# Patient Record
Sex: Female | Born: 1940 | Race: White | Hispanic: No | Marital: Married | Smoking: Never smoker
Health system: Southern US, Community
[De-identification: ages and names within clinical notes are randomized; demographics above are authoritative.]

## PROBLEM LIST (undated history)

## (undated) DIAGNOSIS — R011 Cardiac murmur, unspecified: Secondary | ICD-10-CM

## (undated) DIAGNOSIS — B029 Zoster without complications: Secondary | ICD-10-CM

## (undated) DIAGNOSIS — K219 Gastro-esophageal reflux disease without esophagitis: Secondary | ICD-10-CM

## (undated) DIAGNOSIS — H269 Unspecified cataract: Secondary | ICD-10-CM

## (undated) DIAGNOSIS — M1990 Unspecified osteoarthritis, unspecified site: Secondary | ICD-10-CM

## (undated) DIAGNOSIS — E7800 Pure hypercholesterolemia, unspecified: Secondary | ICD-10-CM

## (undated) DIAGNOSIS — N3289 Other specified disorders of bladder: Secondary | ICD-10-CM

## (undated) DIAGNOSIS — I10 Essential (primary) hypertension: Secondary | ICD-10-CM

## (undated) DIAGNOSIS — R42 Dizziness and giddiness: Secondary | ICD-10-CM

## (undated) DIAGNOSIS — E119 Type 2 diabetes mellitus without complications: Secondary | ICD-10-CM

## (undated) HISTORY — DX: Gastro-esophageal reflux disease without esophagitis: K21.9

## (undated) HISTORY — PX: CATARACT EXTRACTION W/ INTRAOCULAR LENS IMPLANT: SHX1309

## (undated) HISTORY — PX: TRIGGER FINGER RELEASE: SHX641

## (undated) HISTORY — PX: BREAST EXCISIONAL BIOPSY: SUR124

## (undated) HISTORY — DX: Unspecified osteoarthritis, unspecified site: M19.90

## (undated) HISTORY — PX: COLONOSCOPY: SHX174

---

## 1999-04-11 ENCOUNTER — Emergency Department (HOSPITAL_COMMUNITY): Admission: EM | Admit: 1999-04-11 | Discharge: 1999-04-11 | Payer: Self-pay | Admitting: *Deleted

## 2005-05-17 ENCOUNTER — Encounter: Admission: RE | Admit: 2005-05-17 | Discharge: 2005-05-17 | Payer: Self-pay | Admitting: Family Medicine

## 2005-09-14 ENCOUNTER — Encounter: Admission: RE | Admit: 2005-09-14 | Discharge: 2005-09-14 | Payer: Self-pay | Admitting: Family Medicine

## 2005-12-18 ENCOUNTER — Encounter: Admission: RE | Admit: 2005-12-18 | Discharge: 2006-01-17 | Payer: Self-pay | Admitting: Neurology

## 2006-05-20 ENCOUNTER — Encounter: Admission: RE | Admit: 2006-05-20 | Discharge: 2006-05-20 | Payer: Self-pay | Admitting: Family Medicine

## 2006-05-27 ENCOUNTER — Encounter: Admission: RE | Admit: 2006-05-27 | Discharge: 2006-05-27 | Payer: Self-pay | Admitting: Family Medicine

## 2006-12-10 ENCOUNTER — Encounter: Admission: RE | Admit: 2006-12-10 | Discharge: 2006-12-10 | Payer: Self-pay | Admitting: Family Medicine

## 2007-05-07 ENCOUNTER — Other Ambulatory Visit: Admission: RE | Admit: 2007-05-07 | Discharge: 2007-05-07 | Payer: Self-pay | Admitting: Family Medicine

## 2007-06-03 ENCOUNTER — Encounter: Admission: RE | Admit: 2007-06-03 | Discharge: 2007-06-03 | Payer: Self-pay | Admitting: Family Medicine

## 2008-05-10 ENCOUNTER — Encounter: Admission: RE | Admit: 2008-05-10 | Discharge: 2008-05-10 | Payer: Self-pay | Admitting: Family Medicine

## 2008-06-14 ENCOUNTER — Encounter: Admission: RE | Admit: 2008-06-14 | Discharge: 2008-06-14 | Payer: Self-pay | Admitting: Family Medicine

## 2009-03-25 ENCOUNTER — Emergency Department (HOSPITAL_COMMUNITY): Admission: EM | Admit: 2009-03-25 | Discharge: 2009-03-25 | Payer: Self-pay | Admitting: Emergency Medicine

## 2009-05-13 ENCOUNTER — Encounter: Admission: RE | Admit: 2009-05-13 | Discharge: 2009-05-13 | Payer: Self-pay | Admitting: Family Medicine

## 2009-10-06 ENCOUNTER — Encounter: Admission: RE | Admit: 2009-10-06 | Discharge: 2009-10-06 | Payer: Self-pay | Admitting: Family Medicine

## 2010-05-22 ENCOUNTER — Other Ambulatory Visit: Admission: RE | Admit: 2010-05-22 | Discharge: 2010-05-22 | Payer: Self-pay | Admitting: Family Medicine

## 2010-10-06 LAB — BASIC METABOLIC PANEL
BUN: 17 mg/dL (ref 6–23)
CO2: 26 mEq/L (ref 19–32)
Calcium: 9.3 mg/dL (ref 8.4–10.5)
Chloride: 103 mEq/L (ref 96–112)
Creatinine, Ser: 1.46 mg/dL — ABNORMAL HIGH (ref 0.4–1.2)
GFR calc Af Amer: 43 mL/min — ABNORMAL LOW (ref >60)
GFR calc non Af Amer: 36 mL/min — ABNORMAL LOW (ref >60)
Glucose, Bld: 141 mg/dL — ABNORMAL HIGH (ref 70–99)
Potassium: 4 mEq/L (ref 3.5–5.1)
Sodium: 137 mEq/L (ref 135–145)

## 2010-10-06 LAB — DIFFERENTIAL
Basophils Absolute: 0 10*3/uL (ref 0.0–0.1)
Basophils Relative: 0 % (ref 0–1)
Eosinophils Absolute: 0.2 10*3/uL (ref 0.0–0.7)
Eosinophils Relative: 2 % (ref 0–5)
Lymphocytes Relative: 15 % (ref 12–46)
Lymphs Abs: 1.9 10*3/uL (ref 0.7–4.0)
Monocytes Absolute: 0.7 10*3/uL (ref 0.1–1.0)
Monocytes Relative: 5 % (ref 3–12)
Neutro Abs: 10 10*3/uL — ABNORMAL HIGH (ref 1.7–7.7)
Neutrophils Relative %: 78 % — ABNORMAL HIGH (ref 43–77)

## 2010-10-06 LAB — CBC
HCT: 38.5 % (ref 36.0–46.0)
Hemoglobin: 13.1 g/dL (ref 12.0–15.0)
MCHC: 34 g/dL (ref 30.0–36.0)
MCV: 88.5 fL (ref 78.0–100.0)
Platelets: 271 10*3/uL (ref 150–400)
RBC: 4.35 MIL/uL (ref 3.87–5.11)
RDW: 13.2 % (ref 11.5–15.5)
WBC: 12.8 10*3/uL — ABNORMAL HIGH (ref 4.0–10.5)

## 2010-11-20 ENCOUNTER — Other Ambulatory Visit: Payer: Self-pay | Admitting: Family Medicine

## 2010-11-20 DIAGNOSIS — Z1231 Encounter for screening mammogram for malignant neoplasm of breast: Secondary | ICD-10-CM

## 2010-11-30 ENCOUNTER — Ambulatory Visit
Admission: RE | Admit: 2010-11-30 | Discharge: 2010-11-30 | Disposition: A | Discharge status: Home / Self Care | Payer: Medicare Other | Source: Ambulatory Visit | Attending: Family Medicine | Admitting: Family Medicine

## 2010-11-30 DIAGNOSIS — Z1231 Encounter for screening mammogram for malignant neoplasm of breast: Secondary | ICD-10-CM

## 2011-07-17 DIAGNOSIS — H7310 Chronic myringitis, unspecified ear: Secondary | ICD-10-CM | POA: Diagnosis not present

## 2011-07-18 DIAGNOSIS — L259 Unspecified contact dermatitis, unspecified cause: Secondary | ICD-10-CM | POA: Diagnosis not present

## 2011-07-18 DIAGNOSIS — L280 Lichen simplex chronicus: Secondary | ICD-10-CM | POA: Diagnosis not present

## 2011-07-30 DIAGNOSIS — H7310 Chronic myringitis, unspecified ear: Secondary | ICD-10-CM | POA: Diagnosis not present

## 2011-08-01 ENCOUNTER — Other Ambulatory Visit: Payer: Self-pay | Admitting: Otolaryngology

## 2011-08-01 DIAGNOSIS — H8110 Benign paroxysmal vertigo, unspecified ear: Secondary | ICD-10-CM

## 2011-08-07 DIAGNOSIS — H25019 Cortical age-related cataract, unspecified eye: Secondary | ICD-10-CM | POA: Diagnosis not present

## 2011-09-03 ENCOUNTER — Other Ambulatory Visit: Payer: Medicare Other

## 2011-09-10 ENCOUNTER — Ambulatory Visit
Admission: RE | Admit: 2011-09-10 | Discharge: 2011-09-10 | Disposition: A | Discharge status: Home / Self Care | Payer: Medicare Other | Source: Ambulatory Visit | Attending: Otolaryngology | Admitting: Otolaryngology

## 2011-09-10 DIAGNOSIS — H8110 Benign paroxysmal vertigo, unspecified ear: Secondary | ICD-10-CM

## 2011-09-10 DIAGNOSIS — J320 Chronic maxillary sinusitis: Secondary | ICD-10-CM | POA: Diagnosis not present

## 2011-09-10 MED ORDER — GADOBENATE DIMEGLUMINE 529 MG/ML IV SOLN
15.0000 mL | Freq: Once | INTRAVENOUS | Status: AC | PRN
  Administered 2011-09-10: 15 mL via INTRAVENOUS

## 2011-09-26 DIAGNOSIS — H8110 Benign paroxysmal vertigo, unspecified ear: Secondary | ICD-10-CM | POA: Diagnosis not present

## 2011-10-01 DIAGNOSIS — R42 Dizziness and giddiness: Secondary | ICD-10-CM | POA: Diagnosis not present

## 2011-10-04 ENCOUNTER — Ambulatory Visit: Payer: Medicare Other | Admitting: Physical Therapy

## 2011-10-09 ENCOUNTER — Ambulatory Visit: Payer: Medicare Other | Attending: Neurology | Admitting: Physical Therapy

## 2011-10-09 DIAGNOSIS — H8110 Benign paroxysmal vertigo, unspecified ear: Secondary | ICD-10-CM | POA: Insufficient documentation

## 2011-10-09 DIAGNOSIS — IMO0001 Reserved for inherently not codable concepts without codable children: Secondary | ICD-10-CM | POA: Insufficient documentation

## 2011-10-12 ENCOUNTER — Ambulatory Visit: Payer: Medicare Other | Admitting: Rehabilitative and Restorative Service Providers"

## 2011-10-12 DIAGNOSIS — H8110 Benign paroxysmal vertigo, unspecified ear: Secondary | ICD-10-CM | POA: Diagnosis not present

## 2011-10-12 DIAGNOSIS — IMO0001 Reserved for inherently not codable concepts without codable children: Secondary | ICD-10-CM | POA: Diagnosis not present

## 2011-10-17 ENCOUNTER — Ambulatory Visit: Payer: Medicare Other | Admitting: Physical Therapy

## 2011-10-17 DIAGNOSIS — IMO0001 Reserved for inherently not codable concepts without codable children: Secondary | ICD-10-CM | POA: Diagnosis not present

## 2011-10-17 DIAGNOSIS — H8110 Benign paroxysmal vertigo, unspecified ear: Secondary | ICD-10-CM | POA: Diagnosis not present

## 2011-10-19 ENCOUNTER — Ambulatory Visit: Payer: Medicare Other | Admitting: Rehabilitative and Restorative Service Providers"

## 2011-10-19 DIAGNOSIS — IMO0001 Reserved for inherently not codable concepts without codable children: Secondary | ICD-10-CM | POA: Diagnosis not present

## 2011-10-19 DIAGNOSIS — H8110 Benign paroxysmal vertigo, unspecified ear: Secondary | ICD-10-CM | POA: Diagnosis not present

## 2011-10-23 ENCOUNTER — Encounter: Payer: PRIVATE HEALTH INSURANCE | Admitting: Physical Therapy

## 2011-10-25 ENCOUNTER — Encounter: Payer: PRIVATE HEALTH INSURANCE | Admitting: Physical Therapy

## 2011-10-30 ENCOUNTER — Encounter: Payer: PRIVATE HEALTH INSURANCE | Admitting: Physical Therapy

## 2011-11-01 ENCOUNTER — Encounter: Payer: PRIVATE HEALTH INSURANCE | Admitting: Rehabilitative and Restorative Service Providers"

## 2011-11-06 ENCOUNTER — Ambulatory Visit: Payer: Medicare Other | Attending: Neurology | Admitting: Physical Therapy

## 2011-11-06 DIAGNOSIS — H8110 Benign paroxysmal vertigo, unspecified ear: Secondary | ICD-10-CM | POA: Diagnosis not present

## 2011-11-06 DIAGNOSIS — IMO0001 Reserved for inherently not codable concepts without codable children: Secondary | ICD-10-CM | POA: Diagnosis not present

## 2011-11-08 ENCOUNTER — Ambulatory Visit: Payer: Medicare Other | Admitting: Physical Therapy

## 2011-11-08 DIAGNOSIS — H8110 Benign paroxysmal vertigo, unspecified ear: Secondary | ICD-10-CM | POA: Diagnosis not present

## 2011-11-08 DIAGNOSIS — IMO0001 Reserved for inherently not codable concepts without codable children: Secondary | ICD-10-CM | POA: Diagnosis not present

## 2011-11-09 ENCOUNTER — Other Ambulatory Visit: Payer: Self-pay | Admitting: Family Medicine

## 2011-11-09 DIAGNOSIS — Z1231 Encounter for screening mammogram for malignant neoplasm of breast: Secondary | ICD-10-CM

## 2011-11-13 ENCOUNTER — Ambulatory Visit: Payer: Medicare Other | Admitting: Physical Therapy

## 2011-11-13 DIAGNOSIS — H8110 Benign paroxysmal vertigo, unspecified ear: Secondary | ICD-10-CM | POA: Diagnosis not present

## 2011-11-13 DIAGNOSIS — IMO0001 Reserved for inherently not codable concepts without codable children: Secondary | ICD-10-CM | POA: Diagnosis not present

## 2011-11-15 ENCOUNTER — Ambulatory Visit: Payer: Medicare Other | Admitting: Physical Therapy

## 2011-11-15 DIAGNOSIS — H8110 Benign paroxysmal vertigo, unspecified ear: Secondary | ICD-10-CM | POA: Diagnosis not present

## 2011-11-15 DIAGNOSIS — IMO0001 Reserved for inherently not codable concepts without codable children: Secondary | ICD-10-CM | POA: Diagnosis not present

## 2011-11-19 ENCOUNTER — Ambulatory Visit: Payer: Medicare Other | Admitting: Physical Therapy

## 2011-11-19 DIAGNOSIS — H8110 Benign paroxysmal vertigo, unspecified ear: Secondary | ICD-10-CM | POA: Diagnosis not present

## 2011-11-19 DIAGNOSIS — IMO0001 Reserved for inherently not codable concepts without codable children: Secondary | ICD-10-CM | POA: Diagnosis not present

## 2011-11-21 ENCOUNTER — Ambulatory Visit: Payer: Medicare Other | Admitting: Physical Therapy

## 2011-11-21 DIAGNOSIS — IMO0001 Reserved for inherently not codable concepts without codable children: Secondary | ICD-10-CM | POA: Diagnosis not present

## 2011-11-21 DIAGNOSIS — H8110 Benign paroxysmal vertigo, unspecified ear: Secondary | ICD-10-CM | POA: Diagnosis not present

## 2011-12-04 ENCOUNTER — Ambulatory Visit: Payer: PRIVATE HEALTH INSURANCE

## 2011-12-05 ENCOUNTER — Ambulatory Visit
Admission: RE | Admit: 2011-12-05 | Discharge: 2011-12-05 | Disposition: A | Discharge status: Home / Self Care | Payer: PRIVATE HEALTH INSURANCE | Source: Ambulatory Visit | Attending: Family Medicine | Admitting: Family Medicine

## 2011-12-05 DIAGNOSIS — Z1231 Encounter for screening mammogram for malignant neoplasm of breast: Secondary | ICD-10-CM

## 2011-12-12 ENCOUNTER — Ambulatory Visit
Admission: RE | Admit: 2011-12-12 | Discharge: 2011-12-12 | Disposition: A | Discharge status: Home / Self Care | Payer: Medicare Other | Source: Ambulatory Visit | Attending: Family Medicine | Admitting: Family Medicine

## 2011-12-12 ENCOUNTER — Other Ambulatory Visit: Payer: Self-pay | Admitting: Family Medicine

## 2011-12-12 DIAGNOSIS — E119 Type 2 diabetes mellitus without complications: Secondary | ICD-10-CM | POA: Diagnosis not present

## 2011-12-12 DIAGNOSIS — M79609 Pain in unspecified limb: Secondary | ICD-10-CM | POA: Diagnosis not present

## 2011-12-12 DIAGNOSIS — M899 Disorder of bone, unspecified: Secondary | ICD-10-CM | POA: Diagnosis not present

## 2011-12-12 DIAGNOSIS — I10 Essential (primary) hypertension: Secondary | ICD-10-CM | POA: Diagnosis not present

## 2011-12-12 DIAGNOSIS — M7989 Other specified soft tissue disorders: Secondary | ICD-10-CM | POA: Diagnosis not present

## 2011-12-12 DIAGNOSIS — R011 Cardiac murmur, unspecified: Secondary | ICD-10-CM | POA: Diagnosis not present

## 2011-12-12 DIAGNOSIS — E7800 Pure hypercholesterolemia, unspecified: Secondary | ICD-10-CM | POA: Diagnosis not present

## 2011-12-12 DIAGNOSIS — N814 Uterovaginal prolapse, unspecified: Secondary | ICD-10-CM | POA: Diagnosis not present

## 2012-01-23 DIAGNOSIS — R109 Unspecified abdominal pain: Secondary | ICD-10-CM | POA: Diagnosis not present

## 2012-02-04 DIAGNOSIS — N814 Uterovaginal prolapse, unspecified: Secondary | ICD-10-CM | POA: Diagnosis not present

## 2012-02-04 DIAGNOSIS — R3915 Urgency of urination: Secondary | ICD-10-CM | POA: Diagnosis not present

## 2012-02-04 DIAGNOSIS — E7800 Pure hypercholesterolemia, unspecified: Secondary | ICD-10-CM | POA: Diagnosis not present

## 2012-02-04 DIAGNOSIS — N390 Urinary tract infection, site not specified: Secondary | ICD-10-CM | POA: Diagnosis not present

## 2012-02-19 DIAGNOSIS — N390 Urinary tract infection, site not specified: Secondary | ICD-10-CM | POA: Diagnosis not present

## 2012-02-19 DIAGNOSIS — R4182 Altered mental status, unspecified: Secondary | ICD-10-CM | POA: Diagnosis not present

## 2012-02-19 DIAGNOSIS — R0602 Shortness of breath: Secondary | ICD-10-CM | POA: Diagnosis not present

## 2012-02-19 DIAGNOSIS — R509 Fever, unspecified: Secondary | ICD-10-CM | POA: Diagnosis not present

## 2012-02-22 DIAGNOSIS — N390 Urinary tract infection, site not specified: Secondary | ICD-10-CM | POA: Diagnosis not present

## 2012-02-28 DIAGNOSIS — N390 Urinary tract infection, site not specified: Secondary | ICD-10-CM | POA: Diagnosis not present

## 2012-02-28 DIAGNOSIS — R3915 Urgency of urination: Secondary | ICD-10-CM | POA: Diagnosis not present

## 2012-03-07 DIAGNOSIS — R350 Frequency of micturition: Secondary | ICD-10-CM | POA: Diagnosis not present

## 2012-05-21 DIAGNOSIS — L280 Lichen simplex chronicus: Secondary | ICD-10-CM | POA: Diagnosis not present

## 2012-05-21 DIAGNOSIS — L821 Other seborrheic keratosis: Secondary | ICD-10-CM | POA: Diagnosis not present

## 2012-05-21 DIAGNOSIS — I789 Disease of capillaries, unspecified: Secondary | ICD-10-CM | POA: Diagnosis not present

## 2012-06-04 DIAGNOSIS — Z23 Encounter for immunization: Secondary | ICD-10-CM | POA: Diagnosis not present

## 2012-06-12 DIAGNOSIS — Z1331 Encounter for screening for depression: Secondary | ICD-10-CM | POA: Diagnosis not present

## 2012-06-12 DIAGNOSIS — D485 Neoplasm of uncertain behavior of skin: Secondary | ICD-10-CM | POA: Diagnosis not present

## 2012-06-12 DIAGNOSIS — R232 Flushing: Secondary | ICD-10-CM | POA: Diagnosis not present

## 2012-06-12 DIAGNOSIS — E7800 Pure hypercholesterolemia, unspecified: Secondary | ICD-10-CM | POA: Diagnosis not present

## 2012-06-12 DIAGNOSIS — I10 Essential (primary) hypertension: Secondary | ICD-10-CM | POA: Diagnosis not present

## 2012-06-12 DIAGNOSIS — Z1329 Encounter for screening for other suspected endocrine disorder: Secondary | ICD-10-CM | POA: Diagnosis not present

## 2012-06-12 DIAGNOSIS — E559 Vitamin D deficiency, unspecified: Secondary | ICD-10-CM | POA: Diagnosis not present

## 2012-06-12 DIAGNOSIS — M899 Disorder of bone, unspecified: Secondary | ICD-10-CM | POA: Diagnosis not present

## 2012-06-12 DIAGNOSIS — M949 Disorder of cartilage, unspecified: Secondary | ICD-10-CM | POA: Diagnosis not present

## 2012-06-12 DIAGNOSIS — E119 Type 2 diabetes mellitus without complications: Secondary | ICD-10-CM | POA: Diagnosis not present

## 2012-06-17 DIAGNOSIS — H903 Sensorineural hearing loss, bilateral: Secondary | ICD-10-CM | POA: Diagnosis not present

## 2012-06-17 DIAGNOSIS — H6120 Impacted cerumen, unspecified ear: Secondary | ICD-10-CM | POA: Diagnosis not present

## 2012-06-17 DIAGNOSIS — H832X3 Labyrinthine dysfunction, bilateral: Secondary | ICD-10-CM | POA: Diagnosis not present

## 2012-06-19 ENCOUNTER — Other Ambulatory Visit: Payer: Self-pay | Admitting: Family Medicine

## 2012-06-19 DIAGNOSIS — L819 Disorder of pigmentation, unspecified: Secondary | ICD-10-CM | POA: Diagnosis not present

## 2012-06-19 DIAGNOSIS — L570 Actinic keratosis: Secondary | ICD-10-CM | POA: Diagnosis not present

## 2012-09-15 DIAGNOSIS — E7800 Pure hypercholesterolemia, unspecified: Secondary | ICD-10-CM | POA: Diagnosis not present

## 2012-09-15 DIAGNOSIS — R3915 Urgency of urination: Secondary | ICD-10-CM | POA: Diagnosis not present

## 2012-10-16 DIAGNOSIS — M7700 Medial epicondylitis, unspecified elbow: Secondary | ICD-10-CM | POA: Diagnosis not present

## 2012-10-24 DIAGNOSIS — M7700 Medial epicondylitis, unspecified elbow: Secondary | ICD-10-CM | POA: Diagnosis not present

## 2012-11-03 ENCOUNTER — Other Ambulatory Visit: Payer: Self-pay

## 2012-11-03 DIAGNOSIS — Z1231 Encounter for screening mammogram for malignant neoplasm of breast: Secondary | ICD-10-CM

## 2012-11-14 DIAGNOSIS — M7700 Medial epicondylitis, unspecified elbow: Secondary | ICD-10-CM | POA: Diagnosis not present

## 2012-11-19 DIAGNOSIS — M7700 Medial epicondylitis, unspecified elbow: Secondary | ICD-10-CM | POA: Diagnosis not present

## 2012-11-26 DIAGNOSIS — M7700 Medial epicondylitis, unspecified elbow: Secondary | ICD-10-CM | POA: Diagnosis not present

## 2012-12-03 DIAGNOSIS — M7700 Medial epicondylitis, unspecified elbow: Secondary | ICD-10-CM | POA: Diagnosis not present

## 2012-12-09 DIAGNOSIS — E559 Vitamin D deficiency, unspecified: Secondary | ICD-10-CM | POA: Diagnosis not present

## 2012-12-09 DIAGNOSIS — E119 Type 2 diabetes mellitus without complications: Secondary | ICD-10-CM | POA: Diagnosis not present

## 2012-12-09 DIAGNOSIS — M949 Disorder of cartilage, unspecified: Secondary | ICD-10-CM | POA: Diagnosis not present

## 2012-12-09 DIAGNOSIS — E7800 Pure hypercholesterolemia, unspecified: Secondary | ICD-10-CM | POA: Diagnosis not present

## 2012-12-09 DIAGNOSIS — I10 Essential (primary) hypertension: Secondary | ICD-10-CM | POA: Diagnosis not present

## 2012-12-10 DIAGNOSIS — M7700 Medial epicondylitis, unspecified elbow: Secondary | ICD-10-CM | POA: Diagnosis not present

## 2012-12-11 DIAGNOSIS — H349 Unspecified retinal vascular occlusion: Secondary | ICD-10-CM | POA: Diagnosis not present

## 2012-12-11 DIAGNOSIS — E1139 Type 2 diabetes mellitus with other diabetic ophthalmic complication: Secondary | ICD-10-CM | POA: Diagnosis not present

## 2012-12-11 DIAGNOSIS — H524 Presbyopia: Secondary | ICD-10-CM | POA: Diagnosis not present

## 2012-12-11 DIAGNOSIS — H25019 Cortical age-related cataract, unspecified eye: Secondary | ICD-10-CM | POA: Diagnosis not present

## 2012-12-17 DIAGNOSIS — M7700 Medial epicondylitis, unspecified elbow: Secondary | ICD-10-CM | POA: Diagnosis not present

## 2012-12-31 DIAGNOSIS — M7700 Medial epicondylitis, unspecified elbow: Secondary | ICD-10-CM | POA: Diagnosis not present

## 2013-01-09 ENCOUNTER — Ambulatory Visit
Admission: RE | Admit: 2013-01-09 | Discharge: 2013-01-09 | Disposition: A | Discharge status: Home / Self Care | Payer: Medicare Other | Source: Ambulatory Visit

## 2013-01-09 DIAGNOSIS — Z1231 Encounter for screening mammogram for malignant neoplasm of breast: Secondary | ICD-10-CM

## 2013-01-09 DIAGNOSIS — M7700 Medial epicondylitis, unspecified elbow: Secondary | ICD-10-CM | POA: Diagnosis not present

## 2013-01-12 ENCOUNTER — Other Ambulatory Visit: Payer: Self-pay | Admitting: Family Medicine

## 2013-01-12 DIAGNOSIS — N6459 Other signs and symptoms in breast: Secondary | ICD-10-CM

## 2013-01-16 DIAGNOSIS — M7700 Medial epicondylitis, unspecified elbow: Secondary | ICD-10-CM | POA: Diagnosis not present

## 2013-01-21 DIAGNOSIS — M949 Disorder of cartilage, unspecified: Secondary | ICD-10-CM | POA: Diagnosis not present

## 2013-01-21 DIAGNOSIS — M7700 Medial epicondylitis, unspecified elbow: Secondary | ICD-10-CM | POA: Diagnosis not present

## 2013-01-21 DIAGNOSIS — M899 Disorder of bone, unspecified: Secondary | ICD-10-CM | POA: Diagnosis not present

## 2013-01-26 DIAGNOSIS — M899 Disorder of bone, unspecified: Secondary | ICD-10-CM | POA: Diagnosis not present

## 2013-01-28 ENCOUNTER — Other Ambulatory Visit: Payer: Medicare Other

## 2013-01-28 DIAGNOSIS — M7700 Medial epicondylitis, unspecified elbow: Secondary | ICD-10-CM | POA: Diagnosis not present

## 2013-02-03 DIAGNOSIS — M7700 Medial epicondylitis, unspecified elbow: Secondary | ICD-10-CM | POA: Diagnosis not present

## 2013-02-05 ENCOUNTER — Ambulatory Visit
Admission: RE | Admit: 2013-02-05 | Discharge: 2013-02-05 | Disposition: A | Discharge status: Home / Self Care | Payer: Medicare Other | Source: Ambulatory Visit | Attending: Family Medicine | Admitting: Family Medicine

## 2013-02-05 DIAGNOSIS — N6459 Other signs and symptoms in breast: Secondary | ICD-10-CM | POA: Diagnosis not present

## 2013-02-09 DIAGNOSIS — M7700 Medial epicondylitis, unspecified elbow: Secondary | ICD-10-CM | POA: Diagnosis not present

## 2013-04-02 DIAGNOSIS — H348392 Tributary (branch) retinal vein occlusion, unspecified eye, stable: Secondary | ICD-10-CM | POA: Diagnosis not present

## 2013-05-26 DIAGNOSIS — N951 Menopausal and female climacteric states: Secondary | ICD-10-CM | POA: Diagnosis not present

## 2013-05-26 DIAGNOSIS — Z01419 Encounter for gynecological examination (general) (routine) without abnormal findings: Secondary | ICD-10-CM | POA: Diagnosis not present

## 2013-06-10 DIAGNOSIS — I10 Essential (primary) hypertension: Secondary | ICD-10-CM | POA: Diagnosis not present

## 2013-06-10 DIAGNOSIS — E1139 Type 2 diabetes mellitus with other diabetic ophthalmic complication: Secondary | ICD-10-CM | POA: Diagnosis not present

## 2013-06-10 DIAGNOSIS — Z23 Encounter for immunization: Secondary | ICD-10-CM | POA: Diagnosis not present

## 2013-06-10 DIAGNOSIS — R1310 Dysphagia, unspecified: Secondary | ICD-10-CM | POA: Diagnosis not present

## 2013-06-10 DIAGNOSIS — E559 Vitamin D deficiency, unspecified: Secondary | ICD-10-CM | POA: Diagnosis not present

## 2013-06-10 DIAGNOSIS — E7800 Pure hypercholesterolemia, unspecified: Secondary | ICD-10-CM | POA: Diagnosis not present

## 2013-06-10 DIAGNOSIS — M62838 Other muscle spasm: Secondary | ICD-10-CM | POA: Diagnosis not present

## 2013-06-10 DIAGNOSIS — Z1211 Encounter for screening for malignant neoplasm of colon: Secondary | ICD-10-CM | POA: Diagnosis not present

## 2013-06-10 DIAGNOSIS — M899 Disorder of bone, unspecified: Secondary | ICD-10-CM | POA: Diagnosis not present

## 2013-07-10 DIAGNOSIS — L909 Atrophic disorder of skin, unspecified: Secondary | ICD-10-CM | POA: Diagnosis not present

## 2013-07-10 DIAGNOSIS — L821 Other seborrheic keratosis: Secondary | ICD-10-CM | POA: Diagnosis not present

## 2013-07-10 DIAGNOSIS — I781 Nevus, non-neoplastic: Secondary | ICD-10-CM | POA: Diagnosis not present

## 2013-07-23 ENCOUNTER — Other Ambulatory Visit: Payer: Self-pay | Admitting: Gastroenterology

## 2013-07-23 DIAGNOSIS — R1310 Dysphagia, unspecified: Secondary | ICD-10-CM

## 2013-07-23 DIAGNOSIS — Z1211 Encounter for screening for malignant neoplasm of colon: Secondary | ICD-10-CM | POA: Diagnosis not present

## 2013-07-27 ENCOUNTER — Ambulatory Visit
Admission: RE | Admit: 2013-07-27 | Discharge: 2013-07-27 | Disposition: A | Discharge status: Home / Self Care | Payer: Medicare Other | Source: Ambulatory Visit | Attending: Gastroenterology | Admitting: Gastroenterology

## 2013-07-27 DIAGNOSIS — R1310 Dysphagia, unspecified: Secondary | ICD-10-CM

## 2013-08-17 DIAGNOSIS — Z79899 Other long term (current) drug therapy: Secondary | ICD-10-CM | POA: Diagnosis not present

## 2013-08-17 DIAGNOSIS — E7800 Pure hypercholesterolemia, unspecified: Secondary | ICD-10-CM | POA: Diagnosis not present

## 2013-09-08 ENCOUNTER — Other Ambulatory Visit: Payer: Self-pay | Admitting: Gastroenterology

## 2013-09-08 DIAGNOSIS — K5730 Diverticulosis of large intestine without perforation or abscess without bleeding: Secondary | ICD-10-CM | POA: Diagnosis not present

## 2013-09-08 DIAGNOSIS — D129 Benign neoplasm of anus and anal canal: Secondary | ICD-10-CM | POA: Diagnosis not present

## 2013-09-08 DIAGNOSIS — D126 Benign neoplasm of colon, unspecified: Secondary | ICD-10-CM | POA: Diagnosis not present

## 2013-09-08 DIAGNOSIS — Z1211 Encounter for screening for malignant neoplasm of colon: Secondary | ICD-10-CM | POA: Diagnosis not present

## 2013-09-08 DIAGNOSIS — D128 Benign neoplasm of rectum: Secondary | ICD-10-CM | POA: Diagnosis not present

## 2013-10-08 DIAGNOSIS — H348392 Tributary (branch) retinal vein occlusion, unspecified eye, stable: Secondary | ICD-10-CM | POA: Diagnosis not present

## 2013-12-07 DIAGNOSIS — M899 Disorder of bone, unspecified: Secondary | ICD-10-CM | POA: Diagnosis not present

## 2013-12-07 DIAGNOSIS — Z1331 Encounter for screening for depression: Secondary | ICD-10-CM | POA: Diagnosis not present

## 2013-12-07 DIAGNOSIS — I10 Essential (primary) hypertension: Secondary | ICD-10-CM | POA: Diagnosis not present

## 2013-12-07 DIAGNOSIS — Z23 Encounter for immunization: Secondary | ICD-10-CM | POA: Diagnosis not present

## 2013-12-07 DIAGNOSIS — E1139 Type 2 diabetes mellitus with other diabetic ophthalmic complication: Secondary | ICD-10-CM | POA: Diagnosis not present

## 2013-12-07 DIAGNOSIS — E559 Vitamin D deficiency, unspecified: Secondary | ICD-10-CM | POA: Diagnosis not present

## 2013-12-07 DIAGNOSIS — E7800 Pure hypercholesterolemia, unspecified: Secondary | ICD-10-CM | POA: Diagnosis not present

## 2013-12-07 DIAGNOSIS — M949 Disorder of cartilage, unspecified: Secondary | ICD-10-CM | POA: Diagnosis not present

## 2014-01-15 DIAGNOSIS — H25019 Cortical age-related cataract, unspecified eye: Secondary | ICD-10-CM | POA: Diagnosis not present

## 2014-01-15 DIAGNOSIS — H348392 Tributary (branch) retinal vein occlusion, unspecified eye, stable: Secondary | ICD-10-CM | POA: Diagnosis not present

## 2014-01-15 DIAGNOSIS — E1139 Type 2 diabetes mellitus with other diabetic ophthalmic complication: Secondary | ICD-10-CM | POA: Diagnosis not present

## 2014-01-15 DIAGNOSIS — H2510 Age-related nuclear cataract, unspecified eye: Secondary | ICD-10-CM | POA: Diagnosis not present

## 2014-01-20 ENCOUNTER — Other Ambulatory Visit: Payer: Self-pay

## 2014-01-20 DIAGNOSIS — Z1231 Encounter for screening mammogram for malignant neoplasm of breast: Secondary | ICD-10-CM

## 2014-02-09 ENCOUNTER — Ambulatory Visit
Admission: RE | Admit: 2014-02-09 | Discharge: 2014-02-09 | Disposition: A | Discharge status: Home / Self Care | Payer: Medicare Other | Source: Ambulatory Visit

## 2014-02-09 ENCOUNTER — Encounter (INDEPENDENT_AMBULATORY_CARE_PROVIDER_SITE_OTHER): Payer: Self-pay

## 2014-02-09 DIAGNOSIS — Z1231 Encounter for screening mammogram for malignant neoplasm of breast: Secondary | ICD-10-CM

## 2014-04-08 DIAGNOSIS — H34831 Tributary (branch) retinal vein occlusion, right eye: Secondary | ICD-10-CM | POA: Diagnosis not present

## 2014-04-08 DIAGNOSIS — H43813 Vitreous degeneration, bilateral: Secondary | ICD-10-CM | POA: Diagnosis not present

## 2014-04-08 DIAGNOSIS — E119 Type 2 diabetes mellitus without complications: Secondary | ICD-10-CM | POA: Diagnosis not present

## 2014-05-06 DIAGNOSIS — Z23 Encounter for immunization: Secondary | ICD-10-CM | POA: Diagnosis not present

## 2014-06-11 DIAGNOSIS — Z1389 Encounter for screening for other disorder: Secondary | ICD-10-CM | POA: Diagnosis not present

## 2014-06-11 DIAGNOSIS — E780 Pure hypercholesterolemia: Secondary | ICD-10-CM | POA: Diagnosis not present

## 2014-06-11 DIAGNOSIS — E559 Vitamin D deficiency, unspecified: Secondary | ICD-10-CM | POA: Diagnosis not present

## 2014-06-11 DIAGNOSIS — E119 Type 2 diabetes mellitus without complications: Secondary | ICD-10-CM | POA: Diagnosis not present

## 2014-06-11 DIAGNOSIS — I10 Essential (primary) hypertension: Secondary | ICD-10-CM | POA: Diagnosis not present

## 2014-06-11 DIAGNOSIS — M899 Disorder of bone, unspecified: Secondary | ICD-10-CM | POA: Diagnosis not present

## 2014-06-14 DIAGNOSIS — H6123 Impacted cerumen, bilateral: Secondary | ICD-10-CM | POA: Diagnosis not present

## 2014-06-14 DIAGNOSIS — H903 Sensorineural hearing loss, bilateral: Secondary | ICD-10-CM | POA: Diagnosis not present

## 2014-07-13 DIAGNOSIS — L918 Other hypertrophic disorders of the skin: Secondary | ICD-10-CM | POA: Diagnosis not present

## 2014-07-13 DIAGNOSIS — D1800 Hemangioma unspecified site: Secondary | ICD-10-CM | POA: Diagnosis not present

## 2014-07-13 DIAGNOSIS — L570 Actinic keratosis: Secondary | ICD-10-CM | POA: Diagnosis not present

## 2014-07-13 DIAGNOSIS — L821 Other seborrheic keratosis: Secondary | ICD-10-CM | POA: Diagnosis not present

## 2014-10-21 DIAGNOSIS — E119 Type 2 diabetes mellitus without complications: Secondary | ICD-10-CM | POA: Diagnosis not present

## 2014-10-21 DIAGNOSIS — H34831 Tributary (branch) retinal vein occlusion, right eye: Secondary | ICD-10-CM | POA: Diagnosis not present

## 2014-10-21 DIAGNOSIS — H43813 Vitreous degeneration, bilateral: Secondary | ICD-10-CM | POA: Diagnosis not present

## 2014-10-27 DIAGNOSIS — S61432A Puncture wound without foreign body of left hand, initial encounter: Secondary | ICD-10-CM | POA: Diagnosis not present

## 2014-12-15 DIAGNOSIS — I10 Essential (primary) hypertension: Secondary | ICD-10-CM | POA: Diagnosis not present

## 2014-12-15 DIAGNOSIS — M899 Disorder of bone, unspecified: Secondary | ICD-10-CM | POA: Diagnosis not present

## 2014-12-15 DIAGNOSIS — E119 Type 2 diabetes mellitus without complications: Secondary | ICD-10-CM | POA: Diagnosis not present

## 2014-12-15 DIAGNOSIS — E780 Pure hypercholesterolemia: Secondary | ICD-10-CM | POA: Diagnosis not present

## 2014-12-15 DIAGNOSIS — E559 Vitamin D deficiency, unspecified: Secondary | ICD-10-CM | POA: Diagnosis not present

## 2014-12-15 DIAGNOSIS — B349 Viral infection, unspecified: Secondary | ICD-10-CM | POA: Diagnosis not present

## 2014-12-15 DIAGNOSIS — D485 Neoplasm of uncertain behavior of skin: Secondary | ICD-10-CM | POA: Diagnosis not present

## 2015-01-06 DIAGNOSIS — H25013 Cortical age-related cataract, bilateral: Secondary | ICD-10-CM | POA: Diagnosis not present

## 2015-01-06 DIAGNOSIS — H2513 Age-related nuclear cataract, bilateral: Secondary | ICD-10-CM | POA: Diagnosis not present

## 2015-01-06 DIAGNOSIS — E119 Type 2 diabetes mellitus without complications: Secondary | ICD-10-CM | POA: Diagnosis not present

## 2015-01-06 DIAGNOSIS — H349 Unspecified retinal vascular occlusion: Secondary | ICD-10-CM | POA: Diagnosis not present

## 2015-01-12 ENCOUNTER — Other Ambulatory Visit: Payer: Self-pay

## 2015-01-12 DIAGNOSIS — Z1231 Encounter for screening mammogram for malignant neoplasm of breast: Secondary | ICD-10-CM

## 2015-02-02 DIAGNOSIS — M859 Disorder of bone density and structure, unspecified: Secondary | ICD-10-CM | POA: Diagnosis not present

## 2015-02-02 DIAGNOSIS — M8589 Other specified disorders of bone density and structure, multiple sites: Secondary | ICD-10-CM | POA: Diagnosis not present

## 2015-02-11 ENCOUNTER — Ambulatory Visit: Payer: PRIVATE HEALTH INSURANCE

## 2015-02-16 ENCOUNTER — Ambulatory Visit
Admission: RE | Admit: 2015-02-16 | Discharge: 2015-02-16 | Disposition: A | Discharge status: Home / Self Care | Payer: Medicare Other | Source: Ambulatory Visit

## 2015-02-16 ENCOUNTER — Other Ambulatory Visit: Payer: Self-pay

## 2015-02-16 DIAGNOSIS — Z1231 Encounter for screening mammogram for malignant neoplasm of breast: Secondary | ICD-10-CM

## 2015-02-18 ENCOUNTER — Other Ambulatory Visit: Payer: Self-pay | Admitting: Family Medicine

## 2015-02-18 DIAGNOSIS — R928 Other abnormal and inconclusive findings on diagnostic imaging of breast: Secondary | ICD-10-CM

## 2015-02-24 ENCOUNTER — Other Ambulatory Visit: Payer: Self-pay | Admitting: Family Medicine

## 2015-02-24 ENCOUNTER — Ambulatory Visit
Admission: RE | Admit: 2015-02-24 | Discharge: 2015-02-24 | Disposition: A | Discharge status: Home / Self Care | Payer: Medicare Other | Source: Ambulatory Visit | Attending: Family Medicine | Admitting: Family Medicine

## 2015-02-24 DIAGNOSIS — R928 Other abnormal and inconclusive findings on diagnostic imaging of breast: Secondary | ICD-10-CM

## 2015-03-01 ENCOUNTER — Ambulatory Visit
Admission: RE | Admit: 2015-03-01 | Discharge: 2015-03-01 | Disposition: A | Discharge status: Home / Self Care | Payer: Medicare Other | Source: Ambulatory Visit | Attending: Family Medicine | Admitting: Family Medicine

## 2015-03-01 ENCOUNTER — Other Ambulatory Visit: Payer: Self-pay | Admitting: Family Medicine

## 2015-03-01 DIAGNOSIS — N63 Unspecified lump in breast: Secondary | ICD-10-CM | POA: Diagnosis not present

## 2015-03-01 DIAGNOSIS — R928 Other abnormal and inconclusive findings on diagnostic imaging of breast: Secondary | ICD-10-CM

## 2015-03-01 DIAGNOSIS — N6489 Other specified disorders of breast: Secondary | ICD-10-CM | POA: Diagnosis not present

## 2015-05-30 ENCOUNTER — Emergency Department (HOSPITAL_COMMUNITY): Payer: Medicare Other

## 2015-05-30 ENCOUNTER — Encounter (HOSPITAL_COMMUNITY): Payer: Self-pay | Admitting: *Deleted

## 2015-05-30 ENCOUNTER — Emergency Department (HOSPITAL_COMMUNITY)
Admission: EM | Admit: 2015-05-30 | Discharge: 2015-05-30 | Disposition: A | Discharge status: Home / Self Care | Payer: Medicare Other | Attending: Emergency Medicine | Admitting: Emergency Medicine

## 2015-05-30 DIAGNOSIS — E119 Type 2 diabetes mellitus without complications: Secondary | ICD-10-CM | POA: Diagnosis not present

## 2015-05-30 DIAGNOSIS — N12 Tubulo-interstitial nephritis, not specified as acute or chronic: Secondary | ICD-10-CM | POA: Insufficient documentation

## 2015-05-30 DIAGNOSIS — I10 Essential (primary) hypertension: Secondary | ICD-10-CM | POA: Diagnosis not present

## 2015-05-30 DIAGNOSIS — R109 Unspecified abdominal pain: Secondary | ICD-10-CM | POA: Diagnosis not present

## 2015-05-30 HISTORY — DX: Type 2 diabetes mellitus without complications: E11.9

## 2015-05-30 HISTORY — DX: Essential (primary) hypertension: I10

## 2015-05-30 LAB — URINALYSIS, ROUTINE W REFLEX MICROSCOPIC
Bilirubin Urine: NEGATIVE
GLUCOSE, UA: NEGATIVE mg/dL
KETONES UR: NEGATIVE mg/dL
NITRITE: NEGATIVE
PH: 6.5 (ref 5.0–8.0)
PROTEIN: NEGATIVE mg/dL
Specific Gravity, Urine: 1.018 (ref 1.005–1.030)

## 2015-05-30 LAB — BASIC METABOLIC PANEL
ANION GAP: 9 (ref 5–15)
BUN: 15 mg/dL (ref 6–20)
CALCIUM: 9.2 mg/dL (ref 8.9–10.3)
CHLORIDE: 101 mmol/L (ref 101–111)
CO2: 24 mmol/L (ref 22–32)
Creatinine, Ser: 0.85 mg/dL (ref 0.44–1.00)
GFR calc Af Amer: >60 mL/min (ref >60)
GFR calc non Af Amer: >60 mL/min (ref >60)
GLUCOSE: 137 mg/dL — AB (ref 65–99)
Potassium: 4.1 mmol/L (ref 3.5–5.1)
Sodium: 134 mmol/L — ABNORMAL LOW (ref 135–145)

## 2015-05-30 LAB — URINE MICROSCOPIC-ADD ON

## 2015-05-30 LAB — CBC WITH DIFFERENTIAL/PLATELET
BASOS ABS: 0 10*3/uL (ref 0.0–0.1)
BASOS PCT: 0 %
Eosinophils Absolute: 0.4 10*3/uL (ref 0.0–0.7)
Eosinophils Relative: 4 %
HEMATOCRIT: 42.3 % (ref 36.0–46.0)
HEMOGLOBIN: 14.4 g/dL (ref 12.0–15.0)
LYMPHS PCT: 20 %
Lymphs Abs: 2.1 10*3/uL (ref 0.7–4.0)
MCH: 29.6 pg (ref 26.0–34.0)
MCHC: 34 g/dL (ref 30.0–36.0)
MCV: 87 fL (ref 78.0–100.0)
MONO ABS: 0.5 10*3/uL (ref 0.1–1.0)
MONOS PCT: 5 %
NEUTROS ABS: 7.8 10*3/uL — AB (ref 1.7–7.7)
NEUTROS PCT: 71 %
Platelets: 242 10*3/uL (ref 150–400)
RBC: 4.86 MIL/uL (ref 3.87–5.11)
RDW: 12.8 % (ref 11.5–15.5)
WBC: 10.8 10*3/uL — ABNORMAL HIGH (ref 4.0–10.5)

## 2015-05-30 MED ORDER — CEPHALEXIN 500 MG PO CAPS: 500.0000 mg | ORAL_CAPSULE | Freq: Two times a day (BID) | ORAL | Status: DC

## 2015-05-30 MED ORDER — ONDANSETRON 4 MG PO TBDP
8.0000 mg | ORAL_TABLET | Freq: Once | ORAL | Status: AC
  Administered 2015-05-30: 8 mg via ORAL
  Filled 2015-05-30: qty 2

## 2015-05-30 MED ORDER — ONDANSETRON 8 MG PO TBDP: 8.0000 mg | ORAL_TABLET | Freq: Three times a day (TID) | ORAL | Status: DC | PRN

## 2015-05-30 MED ORDER — FENTANYL CITRATE (PF) 100 MCG/2ML IJ SOLN
50.0000 ug | Freq: Once | INTRAMUSCULAR | Status: AC
  Administered 2015-05-30: 50 ug via INTRAVENOUS
  Filled 2015-05-30: qty 2

## 2015-05-30 NOTE — Discharge Instructions (Signed)
We saw you in the ER for the back pain All the results in the ER are normal. No kidney stones. We are not sure what is causing your symptoms. We think that you might have a kidney infection. The workup in the ER is not complete, and is limited to screening for life threatening and emergent conditions only, so please see a primary care doctor for further evaluation.  See your doctor in 3-5 days.   Flank Pain Flank pain refers to pain that is located on the side of the body between the upper abdomen and the back. The pain may occur over a short period of time (acute) or may be long-term or reoccurring (chronic). It may be mild or severe. Flank pain can be caused by many things. CAUSES  Some of the more common causes of flank pain include:  Muscle strains.   Muscle spasms.   A disease of your spine (vertebral disk disease).   A lung infection (pneumonia).   Fluid around your lungs (pulmonary edema).   A kidney infection.   Kidney stones.   A very painful skin rash caused by the chickenpox virus (shingles).   Gallbladder disease.  [place] care will depend on the cause of your pain. In general,  Rest as directed by your caregiver.  Drink enough fluids to keep your urine clear or pale yellow.  Only take over-the-counter or prescription medicines as directed by your caregiver. Some medicines may help relieve the pain.  Tell your caregiver about any changes in your pain.  Follow up with your caregiver as directed. SEEK IMMEDIATE MEDICAL CARE IF:   Your pain is not controlled with medicine.   You have new or worsening symptoms.  Your pain increases.   You have abdominal pain.   You have shortness of breath.   You have persistent nausea or vomiting.   You have swelling in your abdomen.   You feel faint or pass out.   You have blood in your urine.  You have a fever or persistent symptoms for more than 2-3 days.  You have a  fever and your symptoms suddenly get worse. MAKE SURE YOU:   Understand these instructions.  Will watch your condition.  Will get help right away if you are not doing well or get worse.   This information is not intended to replace advice given to you by your health care provider. Make sure you discuss any questions you have with your health care provider.   Document Released: 08/09/2005 Document Revised: 03/12/2012 Document Reviewed: 01/31/2012 Elsevier Interactive Patient Education 2016 Else[employer]  Pyelonephritis, Adult Pyelonephritis is a kidney infection. The kidneys are the organs that filter a person's blood and move waste out of the bloodstream and into the urine. Urine passes from the kidneys, through the ureters, and into the bladder. There are two main types of pyelonephritis:  Infections that come on quickly without any warning (acute pyelonephritis).  Infections that last for a long period of time (chronic pyelonephritis). In most cases, the infection clears up with treatment and does not cause further problems. More severe infections or chronic infections can sometimes spread to the bloodstream or lead to other problems with the kidneys. CAUSES This condition is usually caused by:  Bacteria traveling from the bladder to the kidney through infected urine. The urine in the bladder can become infected with bacteria from:  Bladder infection (cystitis).  Inflammation of the prostate gland (prostatitis).  Sexual intercourse, in females.  Bacteria traveling from the bloodstream to the kidney. RISK FACTORS This condition is more likely to develop in:  Pregnant women.  Older people.  People who have diabetes.  People who have kidney stones or bladder stones.  People who have other abnormalities of the kidney or ureter.  People who have a catheter placed in the bladder.  People who have cancer.  People who are sexually active.  Women who use  spermicides.  People who have had a prior urinary tract infection. SYMPTOMS Symptoms of this condition include:  Frequent urination.  Strong or persistent urge to urinate.  Burning or stinging when urinating.  Abdominal pain.  Back pain.  Pain in the side or flank area.  Fever.  Chills.  Blood in the urine, or dark urine.  Nausea.  Vomiting. DIAGNOSIS This condition may be diagnosed based on:  Medical history and physical exam.  Urine tests.  Blood tests. You may also have imaging tests of the kidneys, such as an ultrasound or CT scan. TREATMENT Treatment for this condition may depend on the severity of the infection.  If the infection is mild and is found early, you may be treated with antibiotic medicines taken by mouth. You will need to drink fluids to remain hydrated.  If the infection is more severe, you may need to stay in the hospital and receive antibiotics given directly into a vein through an IV tube. You may also need to receive fluids through an IV tube if you are not able to remain hydrated. After your hospital stay, you may need to take oral antibiotics for a period of time. Other treatments may be required, depending on the cause of the infection. HOME CARE INSTRUCTIONS Medicines  Take over-the-counter and prescription medicines only as told by your health care provider.  If you were prescribed an antibiotic medicine, take it as told by your health care provider. Do not stop taking the antibiotic even if you start to feel better. General Instructions  Drink enough fluid to keep your urine clear or pale yellow.  Avoid caffeine, tea, and carbonated beverages. They tend to irritate the bladder.  Urinate often. Avoid holding in urine for long periods of time.  Urinate before and after sex.  After a bowel movement, women should cleanse from front to back. Use each tissue only once.  Keep all follow-up visits as told by your health care provider.  This is important. SEEK MEDICAL CARE IF:  Your symptoms do not get better after 2 days of treatment.  Your symptoms get worse.  You have a fever. SEEK IMMEDIATE MEDICAL CARE IF:  You are unable to take your antibiotics or fluids.  You have shaking chills.  You vomit.  You have severe flank or back pain.  You have extreme weakness or fainting.   This information is not intended to replace advice given to you by your health care provider. Make sure you discuss any questions you have with your health care provider.   Document Released: 06/18/2005 Document Revised: 03/09/2015 Document Reviewed: 10/11/2014 Elsevier Interactive Patient Education [employer].

## 2015-05-30 NOTE — ED Provider Notes (Signed)
CSN: [id number]     Arrival date & time 05/30/15  [id number] History   First MD Initiated Contact with Patient 05/30/15 0533     Chief Complaint  Patient presents with  . Flank Pain     (Consider location/radiation/quality/duration/timing/severity/associated sxs/prior Treatment) HPI Comments: Pt comes in with cc of back pain. She has hx of DM. No hx of abd surgeries, and no hx of renal stones or pelvic problems. Reports that she has been having throbbing pain x 2 days on the L flank region. The pain was responding to advil, until last night. She was unable to sleep and the pain is severe. There is + nausea. No uti like sx. Pt has no fevers, chills. No falls.    ROS 10 Systems reviewed and are negative for acute change except as noted in the HPI.     Patient is a 74 y.o. female presenting with flank pain. The history is provided by the patient.  Flank Pain    Past Medical History  Diagnosis Date  . Diabetes mellitus without complication ([place])   . Hypertension    History reviewed. No pertinent past surgical history. No family history on file. Social History  Substance Use Topics  . Smoking status: Never Smoker   . Smokeless tobacco: None  . Alcohol Use: Yes   OB History    No data available     Review of Systems  Genitourinary: Positive for flank pain.      Allergies  Review of patient's allergies indicates no known allergies.  Home Medications   Prior to Admission medications   Medication Sig Start Date End Date Taking? Authorizing Provider  cephALEXin (KEFLEX) 500 MG capsule Take 1 capsule (500 mg total) by mouth 2 (two) times daily. 05/30/15   Varney Biles, MD  ondansetron (ZOFRAN ODT) 8 MG disintegrating tablet Take 1 tablet (8 mg total) by mouth every 8 (eight) hours as needed for nausea. 05/30/15   Sulaiman Imbert Kathrynn Humble, MD   BP 165/88 mmHg  Pulse 75  Temp(Src) 97.8 F (36.6 C)  Resp 20  Ht 5\' 3"  (1.6 m)  Wt 180 lb 8 oz (81.874 kg)  BMI 31.98 kg/m2  SpO2  97% Physical Exam  Constitutional: She is oriented to person, place, and time. She appears well-developed and well-nourished.  HENT:  Head: Normocephalic and atraumatic.  Eyes: EOM are normal. Pupils are equal, round, and reactive to light.  Neck: Neck supple.  Cardiovascular: Normal rate, regular rhythm and normal heart sounds.   No murmur heard. Pulmonary/Chest: Effort normal. No respiratory distress.  Abdominal: Soft. She exhibits no distension. There is tenderness. There is no rebound and no guarding.  Tenderness with palpation  Neurological: She is alert and oriented to person, place, and time.  Skin: Skin is warm and dry.  Nursing note and vitals reviewed.   ED Course  Procedures (including critical care time) Labs Review Labs Reviewed  URINALYSIS, ROUTINE W REFLEX MICROSCOPIC (NOT AT [facility]) - Abnormal; Notable for the following:    Hgb urine dipstick TRACE (*)    Leukocytes, UA MODERATE (*)    All other components within normal limits  CBC WITH DIFFERENTIAL/PLATELET - Abnormal; Notable for the following:    WBC 10.8 (*)    Neutro Abs 7.8 (*)    All other components within normal limits  BASIC METABOLIC PANEL - Abnormal; Notable for the following:    Sodium 134 (*)    Glucose, Bld 137 (*)    All other components within  normal limits  URINE MICROSCOPIC-ADD ON - Abnormal; Notable for the following:    Squamous Epithelial / LPF 0-5 (*)    Bacteria, UA RARE (*)    All other components within normal limits  URINE CULTURE    Imaging Review Ct Renal Stone Study  05/30/2015  CLINICAL DATA:  Left flank pain and nausea, onset yesterday. EXAM: CT ABDOMEN AND PELVIS WITHOUT CONTRAST TECHNIQUE: Multidetector CT imaging of the abdomen and pelvis was performed following the standard protocol without IV contrast. COMPARISON:  None. FINDINGS: There are no urinary calculi. There is no hydronephrosis or ureteral dilatation. There is prior cholecystectomy. There is mild dilatation of the  extrahepatic bile ducts, likely due to post cholecystectomy reservoir effect. There are unremarkable unenhanced appearances of the liver, spleen, pancreas, adrenals and kidneys. Bowel is remarkable only for mild uncomplicated colonic diverticulosis. The appendix is normal. Uterus and ovaries are normal. The abdominal aorta is normal in caliber with mild atherosclerotic calcification. No acute inflammatory changes are evident in the abdomen or pelvis. There is no adenopathy. There is no ascites. There is no significant abnormality in the lower chest. No significant musculoskeletal lesion. Moderate degenerative lumbar disc disease is present at L4-5 and L5-S1, and there is moderate lumbar facet arthritis from L3 through the sacrum. Tiny fat containing umbilical hernia. IMPRESSION: 1. No acute findings are evident in the abdomen or pelvis. 2. Diverticulosis 3. Degenerative lumbar disc and facet disease. Electronically Signed   By: Andreas Newport M.D.   On: 05/30/2015 06:55   I have personally reviewed and evaluated these images and lab results as part of my medical decision-making.   EKG Interpretation None      MDM   Final diagnoses:  Acute left flank pain  Pyelonephritis    Pt comes in with cc of flank pain. Ct scan ordered for this throbbing, severe pain - and it is neg. DX initially was renal stones, diverticulitis, MS pain, pyelonephritis.  With neg CT, UA is equivocal, so we will tx her as pyelo. Patient is pain free at 8 am - and we will d/c her with strict return precautions.    Varney Biles, MD 05/30/15 [phone]

## 2015-05-30 NOTE — ED Notes (Signed)
The pt is c/o lt flank  Pain since yesterday with nausea

## 2015-05-30 NOTE — ED Notes (Signed)
Ice pack applied to neck, pt c/o nausea and dizziness

## 2015-05-31 DIAGNOSIS — R109 Unspecified abdominal pain: Secondary | ICD-10-CM | POA: Diagnosis not present

## 2015-05-31 LAB — URINE CULTURE: Special Requests: NORMAL

## 2015-06-05 DIAGNOSIS — B029 Zoster without complications: Secondary | ICD-10-CM | POA: Diagnosis not present

## 2015-06-05 DIAGNOSIS — R21 Rash and other nonspecific skin eruption: Secondary | ICD-10-CM | POA: Diagnosis not present

## 2015-06-15 DIAGNOSIS — E559 Vitamin D deficiency, unspecified: Secondary | ICD-10-CM | POA: Diagnosis not present

## 2015-06-15 DIAGNOSIS — I10 Essential (primary) hypertension: Secondary | ICD-10-CM | POA: Diagnosis not present

## 2015-06-15 DIAGNOSIS — E119 Type 2 diabetes mellitus without complications: Secondary | ICD-10-CM | POA: Diagnosis not present

## 2015-06-15 DIAGNOSIS — B029 Zoster without complications: Secondary | ICD-10-CM | POA: Diagnosis not present

## 2015-06-15 DIAGNOSIS — E7800 Pure hypercholesterolemia, unspecified: Secondary | ICD-10-CM | POA: Diagnosis not present

## 2015-06-15 DIAGNOSIS — M899 Disorder of bone, unspecified: Secondary | ICD-10-CM | POA: Diagnosis not present

## 2015-07-04 DIAGNOSIS — B0229 Other postherpetic nervous system involvement: Secondary | ICD-10-CM | POA: Diagnosis not present

## 2015-07-12 DIAGNOSIS — D3132 Benign neoplasm of left choroid: Secondary | ICD-10-CM | POA: Diagnosis not present

## 2015-07-12 DIAGNOSIS — H25813 Combined forms of age-related cataract, bilateral: Secondary | ICD-10-CM | POA: Diagnosis not present

## 2015-07-12 DIAGNOSIS — H348312 Tributary (branch) retinal vein occlusion, right eye, stable: Secondary | ICD-10-CM | POA: Diagnosis not present

## 2015-07-12 DIAGNOSIS — H35033 Hypertensive retinopathy, bilateral: Secondary | ICD-10-CM | POA: Diagnosis not present

## 2015-07-12 DIAGNOSIS — E119 Type 2 diabetes mellitus without complications: Secondary | ICD-10-CM | POA: Diagnosis not present

## 2015-07-27 DIAGNOSIS — L820 Inflamed seborrheic keratosis: Secondary | ICD-10-CM | POA: Diagnosis not present

## 2015-07-27 DIAGNOSIS — Z23 Encounter for immunization: Secondary | ICD-10-CM | POA: Diagnosis not present

## 2015-07-27 DIAGNOSIS — B0229 Other postherpetic nervous system involvement: Secondary | ICD-10-CM | POA: Diagnosis not present

## 2015-07-27 DIAGNOSIS — L821 Other seborrheic keratosis: Secondary | ICD-10-CM | POA: Diagnosis not present

## 2015-07-27 DIAGNOSIS — D1800 Hemangioma unspecified site: Secondary | ICD-10-CM | POA: Diagnosis not present

## 2015-07-27 DIAGNOSIS — D485 Neoplasm of uncertain behavior of skin: Secondary | ICD-10-CM | POA: Diagnosis not present

## 2015-08-25 DIAGNOSIS — B0229 Other postherpetic nervous system involvement: Secondary | ICD-10-CM | POA: Diagnosis not present

## 2015-09-29 ENCOUNTER — Encounter (HOSPITAL_COMMUNITY): Payer: Self-pay

## 2015-09-29 ENCOUNTER — Emergency Department (HOSPITAL_COMMUNITY)
Admission: EM | Admit: 2015-09-29 | Discharge: 2015-09-29 | Disposition: A | Discharge status: Home / Self Care | Payer: Medicare Other | Attending: Emergency Medicine | Admitting: Emergency Medicine

## 2015-09-29 DIAGNOSIS — I10 Essential (primary) hypertension: Secondary | ICD-10-CM | POA: Diagnosis not present

## 2015-09-29 DIAGNOSIS — R55 Syncope and collapse: Secondary | ICD-10-CM | POA: Diagnosis not present

## 2015-09-29 DIAGNOSIS — Z8619 Personal history of other infectious and parasitic diseases: Secondary | ICD-10-CM | POA: Insufficient documentation

## 2015-09-29 DIAGNOSIS — E119 Type 2 diabetes mellitus without complications: Secondary | ICD-10-CM | POA: Diagnosis not present

## 2015-09-29 DIAGNOSIS — Z79899 Other long term (current) drug therapy: Secondary | ICD-10-CM | POA: Insufficient documentation

## 2015-09-29 DIAGNOSIS — Z7982 Long term (current) use of aspirin: Secondary | ICD-10-CM | POA: Diagnosis not present

## 2015-09-29 HISTORY — DX: Zoster without complications: B02.9

## 2015-09-29 LAB — COMPREHENSIVE METABOLIC PANEL
ALBUMIN: 3.7 g/dL (ref 3.5–5.0)
ALT: 15 U/L (ref 14–54)
AST: 22 U/L (ref 15–41)
Alkaline Phosphatase: 44 U/L (ref 38–126)
Anion gap: 11 (ref 5–15)
BUN: 14 mg/dL (ref 6–20)
CHLORIDE: 106 mmol/L (ref 101–111)
CO2: 23 mmol/L (ref 22–32)
Calcium: 9.6 mg/dL (ref 8.9–10.3)
Creatinine, Ser: 1.27 mg/dL — ABNORMAL HIGH (ref 0.44–1.00)
GFR calc Af Amer: 47 mL/min — ABNORMAL LOW (ref >60)
GFR calc non Af Amer: 40 mL/min — ABNORMAL LOW (ref >60)
GLUCOSE: 147 mg/dL — AB (ref 65–99)
POTASSIUM: 3.9 mmol/L (ref 3.5–5.1)
Sodium: 140 mmol/L (ref 135–145)
Total Bilirubin: 0.5 mg/dL (ref 0.3–1.2)
Total Protein: 6.5 g/dL (ref 6.5–8.1)

## 2015-09-29 LAB — URINE MICROSCOPIC-ADD ON
Bacteria, UA: NONE SEEN
RBC / HPF: NONE SEEN RBC/hpf (ref 0–5)

## 2015-09-29 LAB — TROPONIN I: Troponin I: <0.03 ng/mL (ref <0.031)

## 2015-09-29 LAB — CBC WITH DIFFERENTIAL/PLATELET
Basophils Absolute: 0 10*3/uL (ref 0.0–0.1)
Basophils Relative: 0 %
EOS PCT: 4 %
Eosinophils Absolute: 0.4 10*3/uL (ref 0.0–0.7)
HCT: 40 % (ref 36.0–46.0)
Hemoglobin: 13.2 g/dL (ref 12.0–15.0)
LYMPHS ABS: 4.8 10*3/uL — AB (ref 0.7–4.0)
LYMPHS PCT: 42 %
MCH: 29.1 pg (ref 26.0–34.0)
MCHC: 33 g/dL (ref 30.0–36.0)
MCV: 88.1 fL (ref 78.0–100.0)
MONO ABS: 0.9 10*3/uL (ref 0.1–1.0)
Monocytes Relative: 8 %
Neutro Abs: 5.3 10*3/uL (ref 1.7–7.7)
Neutrophils Relative %: 46 %
PLATELETS: 238 10*3/uL (ref 150–400)
RBC: 4.54 MIL/uL (ref 3.87–5.11)
RDW: 12.8 % (ref 11.5–15.5)
WBC: 11.5 10*3/uL — ABNORMAL HIGH (ref 4.0–10.5)

## 2015-09-29 LAB — URINALYSIS, ROUTINE W REFLEX MICROSCOPIC
BILIRUBIN URINE: NEGATIVE
Glucose, UA: NEGATIVE mg/dL
Hgb urine dipstick: NEGATIVE
KETONES UR: NEGATIVE mg/dL
NITRITE: NEGATIVE
PH: 6 (ref 5.0–8.0)
Protein, ur: NEGATIVE mg/dL
Specific Gravity, Urine: 1.016 (ref 1.005–1.030)

## 2015-09-29 LAB — CBG MONITORING, ED: GLUCOSE-CAPILLARY: 144 mg/dL — AB (ref 65–99)

## 2015-09-29 MED ORDER — SODIUM CHLORIDE 0.9 % IV BOLUS (SEPSIS)
500.0000 mL | Freq: Once | INTRAVENOUS | Status: AC
  Administered 2015-09-29: 500 mL via INTRAVENOUS

## 2015-09-29 NOTE — Discharge Instructions (Signed)

## 2015-09-29 NOTE — ED Provider Notes (Signed)
CSN: [id number]     Arrival date & time 09/29/15  0251 History  By signing my name below, I, Rohini Rajnarayanan, attest that this documentation has been prepared under the direction and in the presence of Quintella Reichert, MD Electronically Signed: Evonnie Dawes, ED Scribe 09/29/2015 at 3:06 AM.   Chief Complaint  Patient presents with  . Loss of Consciousness   The history is provided by the patient and the spouse. No language interpreter was used.   HPI Comments: Jacqueline Stephenson is a 75 y.o. female with a pmhx of Shingles, HTN, and diet controlled DM, who presents to the Emergency Department complaining of a syncopal episode while sitting in the waiting room with her husband, and was seen in the ED immediately after the event. Pt was unconscious for less than a minute. Pt was feeling at baseline before the syncopal episode.Pt reports feeling "strange" at this time. Pt has had shingles since November, 2016. She has taken her shingles medication today. Pt ate dinner normally around 7:30PM tonight. Pt denies any CP, abdominal pain, emesis, or HA.      Past Medical History  Diagnosis Date  . Diabetes mellitus without complication ([place])   . Hypertension   . Shingles    History reviewed. No pertinent past surgical history. No family history on file. Social History  Substance Use Topics  . Smoking status: Never Smoker   . Smokeless tobacco: None  . Alcohol Use: Yes   OB History    No data available     Review of Systems  Cardiovascular: Negative for chest pain.  Gastrointestinal: Negative for vomiting and abdominal pain.  Neurological: Positive for syncope. Negative for headaches.  All other systems reviewed and are negative.   Allergies  Review of patient's allergies indicates no known allergies.  Home Medications   Prior to Admission medications   Medication Sig Start Date End Date Taking? Authorizing Provider  amitriptyline (ELAVIL) 25 MG tablet Take 25 mg by mouth  daily.   Yes Historical Provider, MD  aspirin EC 81 MG tablet Take 81 mg by mouth daily.   Yes Historical Provider, MD  atorvastatin (LIPITOR) 40 MG tablet Take 40 mg by mouth daily.   Yes Historical Provider, MD  cholecalciferol (VITAMIN D) 1000 units tablet Take 2,000 Units by mouth daily.   Yes Historical Provider, MD  lisinopril (PRINIVIL,ZESTRIL) 10 MG tablet Take 10 mg by mouth daily.   Yes Historical Provider, MD  Multiple Vitamin (MULTIVITAMIN WITH MINERALS) TABS tablet Take 1 tablet by mouth daily.   Yes Historical Provider, MD  ondansetron (ZOFRAN ODT) 8 MG disintegrating tablet Take 1 tablet (8 mg total) by mouth every 8 (eight) hours as needed for nausea. 05/30/15  Yes Varney Biles, MD  traMADol (ULTRAM) 50 MG tablet Take 50 mg by mouth 2 (two) times daily as needed for moderate pain.   Yes Historical Provider, MD   BP 112/72 mmHg  Pulse 70  Temp(Src) 97.8 F (36.6 C) (Oral)  Resp 24  Ht 5\' 3"  (1.6 m)  Wt 165 lb (74.844 kg)  BMI 29.24 kg/m2  SpO2 92% Physical Exam  Constitutional: She is oriented to person, place, and time. She appears well-developed and well-nourished.  Uncomfortable appearing.  HENT:  Head: Normocephalic and atraumatic.  Cardiovascular: Normal rate and regular rhythm.   No murmur heard. Pulmonary/Chest: Effort normal and breath sounds normal. No respiratory distress.  Abdominal: Soft. There is no tenderness. There is no rebound and no guarding.  Musculoskeletal: She  exhibits no edema or tenderness.  Neurological: She is alert and oriented to person, place, and time.  Skin: Skin is warm and dry.  Psychiatric: She has a normal mood and affect. Her behavior is normal.  Nursing note and vitals reviewed.   ED Course  Procedures  DIAGNOSTIC STUDIES: Oxygen Saturation is 92% on RA, low by my interpretation.    COORDINATION OF CARE: 2:54 AM-Discussed treatment plan which includes CBG monitoring, UA, blood work, troponin, cardiac monitoring, and EKG  with pt at bedside and pt agreed to plan.   Labs Review Labs Reviewed  COMPREHENSIVE METABOLIC PANEL - Abnormal; Notable for the following:    Glucose, Bld 147 (*)    Creatinine, Ser 1.27 (*)    GFR calc non Af Amer 40 (*)    GFR calc Af Amer 47 (*)    All other components within normal limits  CBC WITH DIFFERENTIAL/PLATELET - Abnormal; Notable for the following:    WBC 11.5 (*)    Lymphs Abs 4.8 (*)    All other components within normal limits  URINALYSIS, ROUTINE W REFLEX MICROSCOPIC (NOT AT [facility]) - Abnormal; Notable for the following:    APPearance CLOUDY (*)    Leukocytes, UA MODERATE (*)    All other components within normal limits  URINE MICROSCOPIC-ADD ON - Abnormal; Notable for the following:    Squamous Epithelial / LPF 0-5 (*)    Casts HYALINE CASTS (*)    All other components within normal limits  CBG MONITORING, ED - Abnormal; Notable for the following:    Glucose-Capillary 144 (*)    All other components within normal limits  TROPONIN I  TROPONIN I    Imaging Review No results found. I have personally reviewed and evaluated these images and lab results as part of my medical decision-making.   EKG Interpretation   Date/Time:  Thursday September 29 2015 02:54:32 EDT Ventricular Rate:  68 PR Interval:  205 QRS Duration: 101 QT Interval:  425 QTC Calculation: 452 R Axis:   45 Text Interpretation:  Sinus rhythm Low voltage, precordial leads Abnormal  inferior Q waves Baseline wander in lead(s) II III aVF V2 V5 V6 Confirmed  by Hazle Coca [phone]) on 09/29/2015 3:03:55 AM      MDM   Final diagnoses:  None   Patient here for evaluation of syncopal events. She was initially uncomfortable-appearing but on repeat evaluation she is in no acute distress and reports feeling improved. Blood pressures in the normal range, which is likely slightly low given her history of hypertension. There is no evidence of acute infectious process at this time. Presentation is not  consistent with ACS, PE, sepsis.  Discussed holding her home blood pressure medications, close outpatient follow-up, close return precautions.  I personally performed the services described in this documentation, which was scribed in my presence. The recorded information has been reviewed and is accurate.     Quintella Reichert, MD 09/29/15 [phone]

## 2015-09-29 NOTE — ED Notes (Signed)
Dr. Rees at bedside  

## 2015-09-29 NOTE — ED Notes (Signed)
Pt was in waiting room with husband when she became unresponsive. Tech first found that the pt was not responding to her husband with her eyes rolled in the back of her head, unknown how long she was unresponsive for. Pt has recently had shingles. Pt is now nauseated. Pt is feeling weak and hot. Pts only pain is where her shingles were.

## 2015-11-07 DIAGNOSIS — H6123 Impacted cerumen, bilateral: Secondary | ICD-10-CM | POA: Diagnosis not present

## 2015-11-07 DIAGNOSIS — H903 Sensorineural hearing loss, bilateral: Secondary | ICD-10-CM | POA: Diagnosis not present

## 2015-11-10 DIAGNOSIS — H10413 Chronic giant papillary conjunctivitis, bilateral: Secondary | ICD-10-CM | POA: Diagnosis not present

## 2015-11-10 DIAGNOSIS — D3132 Benign neoplasm of left choroid: Secondary | ICD-10-CM | POA: Diagnosis not present

## 2015-11-10 DIAGNOSIS — H25813 Combined forms of age-related cataract, bilateral: Secondary | ICD-10-CM | POA: Diagnosis not present

## 2015-11-10 DIAGNOSIS — H348312 Tributary (branch) retinal vein occlusion, right eye, stable: Secondary | ICD-10-CM | POA: Diagnosis not present

## 2015-11-10 DIAGNOSIS — E119 Type 2 diabetes mellitus without complications: Secondary | ICD-10-CM | POA: Diagnosis not present

## 2015-12-14 DIAGNOSIS — I10 Essential (primary) hypertension: Secondary | ICD-10-CM | POA: Diagnosis not present

## 2015-12-14 DIAGNOSIS — E559 Vitamin D deficiency, unspecified: Secondary | ICD-10-CM | POA: Diagnosis not present

## 2015-12-14 DIAGNOSIS — M545 Low back pain: Secondary | ICD-10-CM | POA: Diagnosis not present

## 2015-12-14 DIAGNOSIS — E7800 Pure hypercholesterolemia, unspecified: Secondary | ICD-10-CM | POA: Diagnosis not present

## 2015-12-14 DIAGNOSIS — D692 Other nonthrombocytopenic purpura: Secondary | ICD-10-CM | POA: Diagnosis not present

## 2015-12-14 DIAGNOSIS — B0229 Other postherpetic nervous system involvement: Secondary | ICD-10-CM | POA: Diagnosis not present

## 2015-12-14 DIAGNOSIS — Z0000 Encounter for general adult medical examination without abnormal findings: Secondary | ICD-10-CM | POA: Diagnosis not present

## 2015-12-14 DIAGNOSIS — E119 Type 2 diabetes mellitus without complications: Secondary | ICD-10-CM | POA: Diagnosis not present

## 2015-12-14 DIAGNOSIS — M899 Disorder of bone, unspecified: Secondary | ICD-10-CM | POA: Diagnosis not present

## 2016-01-11 DIAGNOSIS — E119 Type 2 diabetes mellitus without complications: Secondary | ICD-10-CM | POA: Diagnosis not present

## 2016-01-11 DIAGNOSIS — H25813 Combined forms of age-related cataract, bilateral: Secondary | ICD-10-CM | POA: Diagnosis not present

## 2016-01-11 DIAGNOSIS — H348312 Tributary (branch) retinal vein occlusion, right eye, stable: Secondary | ICD-10-CM | POA: Diagnosis not present

## 2016-01-11 DIAGNOSIS — D3132 Benign neoplasm of left choroid: Secondary | ICD-10-CM | POA: Diagnosis not present

## 2016-04-02 ENCOUNTER — Other Ambulatory Visit: Payer: Self-pay | Admitting: Family Medicine

## 2016-04-02 DIAGNOSIS — Z1231 Encounter for screening mammogram for malignant neoplasm of breast: Secondary | ICD-10-CM

## 2016-05-09 ENCOUNTER — Ambulatory Visit
Admission: RE | Admit: 2016-05-09 | Discharge: 2016-05-09 | Disposition: A | Discharge status: Home / Self Care | Payer: Medicare Other | Source: Ambulatory Visit | Attending: Family Medicine | Admitting: Family Medicine

## 2016-05-09 DIAGNOSIS — Z1231 Encounter for screening mammogram for malignant neoplasm of breast: Secondary | ICD-10-CM | POA: Diagnosis not present

## 2016-05-10 DIAGNOSIS — H10413 Chronic giant papillary conjunctivitis, bilateral: Secondary | ICD-10-CM | POA: Diagnosis not present

## 2016-05-10 DIAGNOSIS — H11123 Conjunctival concretions, bilateral: Secondary | ICD-10-CM | POA: Diagnosis not present

## 2016-05-10 DIAGNOSIS — H25813 Combined forms of age-related cataract, bilateral: Secondary | ICD-10-CM | POA: Diagnosis not present

## 2016-05-11 ENCOUNTER — Other Ambulatory Visit: Payer: Self-pay | Admitting: Family Medicine

## 2016-05-11 DIAGNOSIS — R928 Other abnormal and inconclusive findings on diagnostic imaging of breast: Secondary | ICD-10-CM

## 2016-05-21 ENCOUNTER — Ambulatory Visit
Admission: RE | Admit: 2016-05-21 | Discharge: 2016-05-21 | Disposition: A | Discharge status: Home / Self Care | Payer: Medicare Other | Source: Ambulatory Visit | Attending: Family Medicine | Admitting: Family Medicine

## 2016-05-21 ENCOUNTER — Other Ambulatory Visit: Payer: Medicare Other

## 2016-05-21 DIAGNOSIS — R928 Other abnormal and inconclusive findings on diagnostic imaging of breast: Secondary | ICD-10-CM

## 2016-05-22 DIAGNOSIS — H25813 Combined forms of age-related cataract, bilateral: Secondary | ICD-10-CM | POA: Diagnosis not present

## 2016-05-22 DIAGNOSIS — E119 Type 2 diabetes mellitus without complications: Secondary | ICD-10-CM | POA: Diagnosis not present

## 2016-05-22 DIAGNOSIS — H348312 Tributary (branch) retinal vein occlusion, right eye, stable: Secondary | ICD-10-CM | POA: Diagnosis not present

## 2016-05-22 DIAGNOSIS — D3132 Benign neoplasm of left choroid: Secondary | ICD-10-CM | POA: Diagnosis not present

## 2016-06-07 DIAGNOSIS — H2511 Age-related nuclear cataract, right eye: Secondary | ICD-10-CM | POA: Diagnosis not present

## 2016-06-07 DIAGNOSIS — H25011 Cortical age-related cataract, right eye: Secondary | ICD-10-CM | POA: Diagnosis not present

## 2016-06-07 DIAGNOSIS — H268 Other specified cataract: Secondary | ICD-10-CM | POA: Diagnosis not present

## 2016-06-14 DIAGNOSIS — Z23 Encounter for immunization: Secondary | ICD-10-CM | POA: Diagnosis not present

## 2016-06-14 DIAGNOSIS — E559 Vitamin D deficiency, unspecified: Secondary | ICD-10-CM | POA: Diagnosis not present

## 2016-06-14 DIAGNOSIS — E7800 Pure hypercholesterolemia, unspecified: Secondary | ICD-10-CM | POA: Diagnosis not present

## 2016-06-14 DIAGNOSIS — M899 Disorder of bone, unspecified: Secondary | ICD-10-CM | POA: Diagnosis not present

## 2016-06-14 DIAGNOSIS — E119 Type 2 diabetes mellitus without complications: Secondary | ICD-10-CM | POA: Diagnosis not present

## 2016-06-14 DIAGNOSIS — I10 Essential (primary) hypertension: Secondary | ICD-10-CM | POA: Diagnosis not present

## 2016-07-31 DIAGNOSIS — R253 Fasciculation: Secondary | ICD-10-CM | POA: Diagnosis not present

## 2016-08-01 DIAGNOSIS — L821 Other seborrheic keratosis: Secondary | ICD-10-CM | POA: Diagnosis not present

## 2016-08-01 DIAGNOSIS — Z23 Encounter for immunization: Secondary | ICD-10-CM | POA: Diagnosis not present

## 2016-08-01 DIAGNOSIS — L570 Actinic keratosis: Secondary | ICD-10-CM | POA: Diagnosis not present

## 2016-08-01 DIAGNOSIS — D1800 Hemangioma unspecified site: Secondary | ICD-10-CM | POA: Diagnosis not present

## 2016-08-01 DIAGNOSIS — L853 Xerosis cutis: Secondary | ICD-10-CM | POA: Diagnosis not present

## 2016-08-20 DIAGNOSIS — R350 Frequency of micturition: Secondary | ICD-10-CM | POA: Diagnosis not present

## 2016-11-05 DIAGNOSIS — H11123 Conjunctival concretions, bilateral: Secondary | ICD-10-CM | POA: Diagnosis not present

## 2016-12-19 DIAGNOSIS — M899 Disorder of bone, unspecified: Secondary | ICD-10-CM | POA: Diagnosis not present

## 2016-12-19 DIAGNOSIS — E1165 Type 2 diabetes mellitus with hyperglycemia: Secondary | ICD-10-CM | POA: Diagnosis not present

## 2016-12-19 DIAGNOSIS — E669 Obesity, unspecified: Secondary | ICD-10-CM | POA: Diagnosis not present

## 2016-12-19 DIAGNOSIS — R748 Abnormal levels of other serum enzymes: Secondary | ICD-10-CM | POA: Diagnosis not present

## 2016-12-19 DIAGNOSIS — E559 Vitamin D deficiency, unspecified: Secondary | ICD-10-CM | POA: Diagnosis not present

## 2016-12-19 DIAGNOSIS — I10 Essential (primary) hypertension: Secondary | ICD-10-CM | POA: Diagnosis not present

## 2016-12-19 DIAGNOSIS — Z0000 Encounter for general adult medical examination without abnormal findings: Secondary | ICD-10-CM | POA: Diagnosis not present

## 2016-12-19 DIAGNOSIS — E7800 Pure hypercholesterolemia, unspecified: Secondary | ICD-10-CM | POA: Diagnosis not present

## 2016-12-21 ENCOUNTER — Other Ambulatory Visit: Payer: Self-pay | Admitting: Family Medicine

## 2016-12-21 DIAGNOSIS — R945 Abnormal results of liver function studies: Principal | ICD-10-CM

## 2016-12-21 DIAGNOSIS — R7989 Other specified abnormal findings of blood chemistry: Secondary | ICD-10-CM

## 2016-12-25 ENCOUNTER — Ambulatory Visit
Admission: RE | Admit: 2016-12-25 | Discharge: 2016-12-25 | Disposition: A | Discharge status: Home / Self Care | Payer: Medicare Other | Source: Ambulatory Visit | Attending: Family Medicine | Admitting: Family Medicine

## 2016-12-25 DIAGNOSIS — R7989 Other specified abnormal findings of blood chemistry: Secondary | ICD-10-CM

## 2016-12-25 DIAGNOSIS — R945 Abnormal results of liver function studies: Principal | ICD-10-CM

## 2016-12-25 DIAGNOSIS — K760 Fatty (change of) liver, not elsewhere classified: Secondary | ICD-10-CM | POA: Diagnosis not present

## 2016-12-28 ENCOUNTER — Other Ambulatory Visit: Payer: Medicare Other

## 2017-01-11 ENCOUNTER — Other Ambulatory Visit: Payer: Medicare Other

## 2017-01-18 DIAGNOSIS — H25812 Combined forms of age-related cataract, left eye: Secondary | ICD-10-CM | POA: Diagnosis not present

## 2017-01-18 DIAGNOSIS — Z961 Presence of intraocular lens: Secondary | ICD-10-CM | POA: Diagnosis not present

## 2017-01-18 DIAGNOSIS — E119 Type 2 diabetes mellitus without complications: Secondary | ICD-10-CM | POA: Diagnosis not present

## 2017-01-18 DIAGNOSIS — H348312 Tributary (branch) retinal vein occlusion, right eye, stable: Secondary | ICD-10-CM | POA: Diagnosis not present

## 2017-01-18 DIAGNOSIS — D3132 Benign neoplasm of left choroid: Secondary | ICD-10-CM | POA: Diagnosis not present

## 2017-01-29 DIAGNOSIS — R748 Abnormal levels of other serum enzymes: Secondary | ICD-10-CM | POA: Diagnosis not present

## 2017-01-29 DIAGNOSIS — M6530 Trigger finger, unspecified finger: Secondary | ICD-10-CM | POA: Diagnosis not present

## 2017-01-29 DIAGNOSIS — E7800 Pure hypercholesterolemia, unspecified: Secondary | ICD-10-CM | POA: Diagnosis not present

## 2017-02-06 DIAGNOSIS — H903 Sensorineural hearing loss, bilateral: Secondary | ICD-10-CM | POA: Diagnosis not present

## 2017-02-06 DIAGNOSIS — H6123 Impacted cerumen, bilateral: Secondary | ICD-10-CM | POA: Diagnosis not present

## 2017-02-08 DIAGNOSIS — M65332 Trigger finger, left middle finger: Secondary | ICD-10-CM | POA: Diagnosis not present

## 2017-02-08 DIAGNOSIS — M79645 Pain in left finger(s): Secondary | ICD-10-CM | POA: Diagnosis not present

## 2017-02-18 ENCOUNTER — Encounter (INDEPENDENT_AMBULATORY_CARE_PROVIDER_SITE_OTHER): Payer: Medicare Other | Admitting: Ophthalmology

## 2017-02-18 DIAGNOSIS — H2512 Age-related nuclear cataract, left eye: Secondary | ICD-10-CM | POA: Diagnosis not present

## 2017-02-18 DIAGNOSIS — H43813 Vitreous degeneration, bilateral: Secondary | ICD-10-CM | POA: Diagnosis not present

## 2017-02-18 DIAGNOSIS — H35033 Hypertensive retinopathy, bilateral: Secondary | ICD-10-CM

## 2017-02-18 DIAGNOSIS — I10 Essential (primary) hypertension: Secondary | ICD-10-CM

## 2017-02-18 DIAGNOSIS — H34831 Tributary (branch) retinal vein occlusion, right eye, with macular edema: Secondary | ICD-10-CM | POA: Diagnosis not present

## 2017-02-18 DIAGNOSIS — D3132 Benign neoplasm of left choroid: Secondary | ICD-10-CM

## 2017-03-13 DIAGNOSIS — E7800 Pure hypercholesterolemia, unspecified: Secondary | ICD-10-CM | POA: Diagnosis not present

## 2017-03-15 DIAGNOSIS — M65332 Trigger finger, left middle finger: Secondary | ICD-10-CM | POA: Diagnosis not present

## 2017-03-27 DIAGNOSIS — B0059 Other herpesviral disease of eye: Secondary | ICD-10-CM | POA: Diagnosis not present

## 2017-03-27 DIAGNOSIS — Z961 Presence of intraocular lens: Secondary | ICD-10-CM | POA: Diagnosis not present

## 2017-03-27 DIAGNOSIS — H348312 Tributary (branch) retinal vein occlusion, right eye, stable: Secondary | ICD-10-CM | POA: Diagnosis not present

## 2017-03-27 DIAGNOSIS — D3132 Benign neoplasm of left choroid: Secondary | ICD-10-CM | POA: Diagnosis not present

## 2017-03-27 DIAGNOSIS — E119 Type 2 diabetes mellitus without complications: Secondary | ICD-10-CM | POA: Diagnosis not present

## 2017-03-27 DIAGNOSIS — H25812 Combined forms of age-related cataract, left eye: Secondary | ICD-10-CM | POA: Diagnosis not present

## 2017-03-28 DIAGNOSIS — Z23 Encounter for immunization: Secondary | ICD-10-CM | POA: Diagnosis not present

## 2017-03-28 DIAGNOSIS — M5416 Radiculopathy, lumbar region: Secondary | ICD-10-CM | POA: Diagnosis not present

## 2017-04-01 DIAGNOSIS — Z124 Encounter for screening for malignant neoplasm of cervix: Secondary | ICD-10-CM | POA: Diagnosis not present

## 2017-04-01 DIAGNOSIS — Z6833 Body mass index (BMI) 33.0-33.9, adult: Secondary | ICD-10-CM | POA: Diagnosis not present

## 2017-04-01 DIAGNOSIS — N3945 Continuous leakage: Secondary | ICD-10-CM | POA: Diagnosis not present

## 2017-04-02 DIAGNOSIS — B0059 Other herpesviral disease of eye: Secondary | ICD-10-CM | POA: Diagnosis not present

## 2017-04-02 DIAGNOSIS — H348312 Tributary (branch) retinal vein occlusion, right eye, stable: Secondary | ICD-10-CM | POA: Diagnosis not present

## 2017-04-02 DIAGNOSIS — D3132 Benign neoplasm of left choroid: Secondary | ICD-10-CM | POA: Diagnosis not present

## 2017-04-02 DIAGNOSIS — Z961 Presence of intraocular lens: Secondary | ICD-10-CM | POA: Diagnosis not present

## 2017-04-02 DIAGNOSIS — H25812 Combined forms of age-related cataract, left eye: Secondary | ICD-10-CM | POA: Diagnosis not present

## 2017-04-02 DIAGNOSIS — E119 Type 2 diabetes mellitus without complications: Secondary | ICD-10-CM | POA: Diagnosis not present

## 2017-04-11 ENCOUNTER — Other Ambulatory Visit: Payer: Self-pay | Admitting: Family Medicine

## 2017-04-11 DIAGNOSIS — Z1231 Encounter for screening mammogram for malignant neoplasm of breast: Secondary | ICD-10-CM

## 2017-05-10 ENCOUNTER — Ambulatory Visit
Admission: RE | Admit: 2017-05-10 | Discharge: 2017-05-10 | Disposition: A | Discharge status: Home / Self Care | Payer: Medicare Other | Source: Ambulatory Visit | Attending: Family Medicine | Admitting: Family Medicine

## 2017-05-10 DIAGNOSIS — Z1231 Encounter for screening mammogram for malignant neoplasm of breast: Secondary | ICD-10-CM

## 2017-06-19 ENCOUNTER — Encounter (INDEPENDENT_AMBULATORY_CARE_PROVIDER_SITE_OTHER): Payer: Medicare Other | Admitting: Ophthalmology

## 2017-07-15 ENCOUNTER — Encounter (INDEPENDENT_AMBULATORY_CARE_PROVIDER_SITE_OTHER): Payer: Medicare Other | Admitting: Ophthalmology

## 2017-07-15 DIAGNOSIS — D3132 Benign neoplasm of left choroid: Secondary | ICD-10-CM | POA: Diagnosis not present

## 2017-07-15 DIAGNOSIS — H348312 Tributary (branch) retinal vein occlusion, right eye, stable: Secondary | ICD-10-CM | POA: Diagnosis not present

## 2017-07-15 DIAGNOSIS — I10 Essential (primary) hypertension: Secondary | ICD-10-CM | POA: Diagnosis not present

## 2017-07-15 DIAGNOSIS — H35033 Hypertensive retinopathy, bilateral: Secondary | ICD-10-CM | POA: Diagnosis not present

## 2017-07-15 DIAGNOSIS — H43813 Vitreous degeneration, bilateral: Secondary | ICD-10-CM | POA: Diagnosis not present

## 2017-07-16 DIAGNOSIS — E7800 Pure hypercholesterolemia, unspecified: Secondary | ICD-10-CM | POA: Diagnosis not present

## 2017-07-16 DIAGNOSIS — E119 Type 2 diabetes mellitus without complications: Secondary | ICD-10-CM | POA: Diagnosis not present

## 2017-07-16 DIAGNOSIS — E559 Vitamin D deficiency, unspecified: Secondary | ICD-10-CM | POA: Diagnosis not present

## 2017-07-16 DIAGNOSIS — I10 Essential (primary) hypertension: Secondary | ICD-10-CM | POA: Diagnosis not present

## 2017-07-16 DIAGNOSIS — E669 Obesity, unspecified: Secondary | ICD-10-CM | POA: Diagnosis not present

## 2017-07-16 DIAGNOSIS — M899 Disorder of bone, unspecified: Secondary | ICD-10-CM | POA: Diagnosis not present

## 2017-07-16 DIAGNOSIS — M25512 Pain in left shoulder: Secondary | ICD-10-CM | POA: Diagnosis not present

## 2017-07-17 DIAGNOSIS — R3915 Urgency of urination: Secondary | ICD-10-CM | POA: Diagnosis not present

## 2017-07-17 DIAGNOSIS — N3945 Continuous leakage: Secondary | ICD-10-CM | POA: Diagnosis not present

## 2017-08-14 DIAGNOSIS — H01022 Squamous blepharitis right lower eyelid: Secondary | ICD-10-CM | POA: Diagnosis not present

## 2017-08-14 DIAGNOSIS — H01024 Squamous blepharitis left upper eyelid: Secondary | ICD-10-CM | POA: Diagnosis not present

## 2017-08-14 DIAGNOSIS — H01025 Squamous blepharitis left lower eyelid: Secondary | ICD-10-CM | POA: Diagnosis not present

## 2017-08-14 DIAGNOSIS — H01021 Squamous blepharitis right upper eyelid: Secondary | ICD-10-CM | POA: Diagnosis not present

## 2017-08-19 DIAGNOSIS — E119 Type 2 diabetes mellitus without complications: Secondary | ICD-10-CM | POA: Diagnosis not present

## 2017-08-19 DIAGNOSIS — Z961 Presence of intraocular lens: Secondary | ICD-10-CM | POA: Diagnosis not present

## 2017-08-19 DIAGNOSIS — M25512 Pain in left shoulder: Secondary | ICD-10-CM | POA: Diagnosis not present

## 2017-08-19 DIAGNOSIS — M19011 Primary osteoarthritis, right shoulder: Secondary | ICD-10-CM | POA: Diagnosis not present

## 2017-08-19 DIAGNOSIS — H25812 Combined forms of age-related cataract, left eye: Secondary | ICD-10-CM | POA: Diagnosis not present

## 2017-08-19 DIAGNOSIS — M25511 Pain in right shoulder: Secondary | ICD-10-CM | POA: Diagnosis not present

## 2017-08-19 DIAGNOSIS — H10521 Angular blepharoconjunctivitis, right eye: Secondary | ICD-10-CM | POA: Diagnosis not present

## 2017-08-19 DIAGNOSIS — D3132 Benign neoplasm of left choroid: Secondary | ICD-10-CM | POA: Diagnosis not present

## 2017-08-19 DIAGNOSIS — H348312 Tributary (branch) retinal vein occlusion, right eye, stable: Secondary | ICD-10-CM | POA: Diagnosis not present

## 2017-08-19 DIAGNOSIS — B0059 Other herpesviral disease of eye: Secondary | ICD-10-CM | POA: Diagnosis not present

## 2017-08-19 DIAGNOSIS — M19012 Primary osteoarthritis, left shoulder: Secondary | ICD-10-CM | POA: Diagnosis not present

## 2017-08-21 DIAGNOSIS — Z23 Encounter for immunization: Secondary | ICD-10-CM | POA: Diagnosis not present

## 2017-08-21 DIAGNOSIS — L309 Dermatitis, unspecified: Secondary | ICD-10-CM | POA: Diagnosis not present

## 2017-08-21 DIAGNOSIS — D1801 Hemangioma of skin and subcutaneous tissue: Secondary | ICD-10-CM | POA: Diagnosis not present

## 2017-08-21 DIAGNOSIS — D485 Neoplasm of uncertain behavior of skin: Secondary | ICD-10-CM | POA: Diagnosis not present

## 2017-08-21 DIAGNOSIS — R202 Paresthesia of skin: Secondary | ICD-10-CM | POA: Diagnosis not present

## 2017-08-21 DIAGNOSIS — D0471 Carcinoma in situ of skin of right lower limb, including hip: Secondary | ICD-10-CM | POA: Diagnosis not present

## 2017-08-21 DIAGNOSIS — L821 Other seborrheic keratosis: Secondary | ICD-10-CM | POA: Diagnosis not present

## 2017-08-26 DIAGNOSIS — H11121 Conjunctival concretions, right eye: Secondary | ICD-10-CM | POA: Diagnosis not present

## 2017-08-29 DIAGNOSIS — M25512 Pain in left shoulder: Secondary | ICD-10-CM | POA: Diagnosis not present

## 2017-08-29 DIAGNOSIS — M25511 Pain in right shoulder: Secondary | ICD-10-CM | POA: Diagnosis not present

## 2017-08-29 DIAGNOSIS — M19012 Primary osteoarthritis, left shoulder: Secondary | ICD-10-CM | POA: Diagnosis not present

## 2017-08-29 DIAGNOSIS — M19011 Primary osteoarthritis, right shoulder: Secondary | ICD-10-CM | POA: Diagnosis not present

## 2017-09-09 DIAGNOSIS — J069 Acute upper respiratory infection, unspecified: Secondary | ICD-10-CM | POA: Diagnosis not present

## 2017-09-12 DIAGNOSIS — M19012 Primary osteoarthritis, left shoulder: Secondary | ICD-10-CM | POA: Diagnosis not present

## 2017-09-12 DIAGNOSIS — M19011 Primary osteoarthritis, right shoulder: Secondary | ICD-10-CM | POA: Diagnosis not present

## 2017-09-17 DIAGNOSIS — M8588 Other specified disorders of bone density and structure, other site: Secondary | ICD-10-CM | POA: Diagnosis not present

## 2017-09-23 DIAGNOSIS — S76319A Strain of muscle, fascia and tendon of the posterior muscle group at thigh level, unspecified thigh, initial encounter: Secondary | ICD-10-CM | POA: Diagnosis not present

## 2017-10-13 DIAGNOSIS — N390 Urinary tract infection, site not specified: Secondary | ICD-10-CM | POA: Diagnosis not present

## 2017-10-15 DIAGNOSIS — D0471 Carcinoma in situ of skin of right lower limb, including hip: Secondary | ICD-10-CM | POA: Diagnosis not present

## 2017-11-26 ENCOUNTER — Encounter (HOSPITAL_COMMUNITY): Payer: Self-pay | Admitting: Emergency Medicine

## 2017-11-26 ENCOUNTER — Emergency Department (HOSPITAL_COMMUNITY): Payer: Medicare Other

## 2017-11-26 ENCOUNTER — Emergency Department (HOSPITAL_COMMUNITY)
Admission: EM | Admit: 2017-11-26 | Discharge: 2017-11-27 | Disposition: A | Discharge status: Home / Self Care | Payer: Medicare Other | Attending: Emergency Medicine | Admitting: Emergency Medicine

## 2017-11-26 DIAGNOSIS — R0789 Other chest pain: Secondary | ICD-10-CM | POA: Diagnosis not present

## 2017-11-26 DIAGNOSIS — Z5321 Procedure and treatment not carried out due to patient leaving prior to being seen by health care provider: Secondary | ICD-10-CM | POA: Diagnosis not present

## 2017-11-26 DIAGNOSIS — I10 Essential (primary) hypertension: Secondary | ICD-10-CM | POA: Insufficient documentation

## 2017-11-26 HISTORY — DX: Pure hypercholesterolemia, unspecified: E78.00

## 2017-11-26 LAB — CBC
HEMATOCRIT: 41.2 % (ref 36.0–46.0)
HEMOGLOBIN: 13.8 g/dL (ref 12.0–15.0)
MCH: 29.2 pg (ref 26.0–34.0)
MCHC: 33.5 g/dL (ref 30.0–36.0)
MCV: 87.1 fL (ref 78.0–100.0)
Platelets: 231 10*3/uL (ref 150–400)
RBC: 4.73 MIL/uL (ref 3.87–5.11)
RDW: 12.8 % (ref 11.5–15.5)
WBC: 7.6 10*3/uL (ref 4.0–10.5)

## 2017-11-26 LAB — BASIC METABOLIC PANEL
ANION GAP: 10 (ref 5–15)
BUN: 15 mg/dL (ref 6–20)
CALCIUM: 9.6 mg/dL (ref 8.9–10.3)
CO2: 24 mmol/L (ref 22–32)
Chloride: 105 mmol/L (ref 101–111)
Creatinine, Ser: 0.86 mg/dL (ref 0.44–1.00)
GLUCOSE: 149 mg/dL — AB (ref 65–99)
POTASSIUM: 4.1 mmol/L (ref 3.5–5.1)
Sodium: 139 mmol/L (ref 135–145)

## 2017-11-26 LAB — I-STAT TROPONIN, ED: TROPONIN I, POC: 0 ng/mL (ref 0.00–0.08)

## 2017-11-26 NOTE — ED Triage Notes (Signed)
Pt reports "not feeling well" for past few days, pt went to [facility] this AM to pick up her meds and decided to check her BP. States it was 150/110's. Pt rechecked at home and still high. Pt also reports she had slight chest discomfort while in the lobby.

## 2017-11-27 NOTE — ED Notes (Signed)
Called for by X-ray. Pt is not in lobby. Family is gone as well. Called for vitals as well x2 with no reply.

## 2017-11-27 NOTE — ED Notes (Signed)
Follow up call made  Pt is following up w her pcp  bp is better 1250  10/28/17 s Latitia Housewright rn

## 2017-12-02 ENCOUNTER — Encounter (HOSPITAL_COMMUNITY): Payer: Self-pay | Admitting: Emergency Medicine

## 2017-12-02 ENCOUNTER — Emergency Department (HOSPITAL_COMMUNITY)
Admission: EM | Admit: 2017-12-02 | Discharge: 2017-12-02 | Disposition: A | Discharge status: Home / Self Care | Payer: Medicare Other | Attending: Emergency Medicine | Admitting: Emergency Medicine

## 2017-12-02 DIAGNOSIS — R42 Dizziness and giddiness: Secondary | ICD-10-CM | POA: Insufficient documentation

## 2017-12-02 DIAGNOSIS — R112 Nausea with vomiting, unspecified: Secondary | ICD-10-CM | POA: Insufficient documentation

## 2017-12-02 DIAGNOSIS — R51 Headache: Secondary | ICD-10-CM | POA: Insufficient documentation

## 2017-12-02 DIAGNOSIS — Z7982 Long term (current) use of aspirin: Secondary | ICD-10-CM | POA: Diagnosis not present

## 2017-12-02 DIAGNOSIS — E119 Type 2 diabetes mellitus without complications: Secondary | ICD-10-CM | POA: Insufficient documentation

## 2017-12-02 DIAGNOSIS — I10 Essential (primary) hypertension: Secondary | ICD-10-CM | POA: Diagnosis not present

## 2017-12-02 DIAGNOSIS — Z79899 Other long term (current) drug therapy: Secondary | ICD-10-CM | POA: Diagnosis not present

## 2017-12-02 HISTORY — DX: Dizziness and giddiness: R42

## 2017-12-02 LAB — URINALYSIS, ROUTINE W REFLEX MICROSCOPIC
Bilirubin Urine: NEGATIVE
GLUCOSE, UA: NEGATIVE mg/dL
HGB URINE DIPSTICK: NEGATIVE
KETONES UR: NEGATIVE mg/dL
NITRITE: NEGATIVE
PROTEIN: NEGATIVE mg/dL
Specific Gravity, Urine: 1.014 (ref 1.005–1.030)
pH: 6 (ref 5.0–8.0)

## 2017-12-02 LAB — BASIC METABOLIC PANEL
ANION GAP: 8 (ref 5–15)
BUN: 14 mg/dL (ref 6–20)
CALCIUM: 10.5 mg/dL — AB (ref 8.9–10.3)
CO2: 30 mmol/L (ref 22–32)
Chloride: 103 mmol/L (ref 101–111)
Creatinine, Ser: 0.94 mg/dL (ref 0.44–1.00)
GFR calc non Af Amer: 57 mL/min — ABNORMAL LOW (ref >60)
Glucose, Bld: 122 mg/dL — ABNORMAL HIGH (ref 65–99)
Potassium: 4.3 mmol/L (ref 3.5–5.1)
Sodium: 141 mmol/L (ref 135–145)

## 2017-12-02 LAB — CBC
HCT: 43.2 % (ref 36.0–46.0)
HEMOGLOBIN: 14.2 g/dL (ref 12.0–15.0)
MCH: 29.3 pg (ref 26.0–34.0)
MCHC: 32.9 g/dL (ref 30.0–36.0)
MCV: 89.3 fL (ref 78.0–100.0)
Platelets: 265 10*3/uL (ref 150–400)
RBC: 4.84 MIL/uL (ref 3.87–5.11)
RDW: 12.9 % (ref 11.5–15.5)
WBC: 9.3 10*3/uL (ref 4.0–10.5)

## 2017-12-02 MED ORDER — MECLIZINE HCL 25 MG PO TABS: 25.0000 mg | ORAL_TABLET | Freq: Three times a day (TID) | ORAL | 0 refills | Status: DC | PRN

## 2017-12-02 MED ORDER — ONDANSETRON HCL 4 MG PO TABS: 4.0000 mg | ORAL_TABLET | Freq: Three times a day (TID) | ORAL | 0 refills | Status: DC | PRN

## 2017-12-02 MED ORDER — ONDANSETRON 4 MG PO TBDP
4.0000 mg | ORAL_TABLET | Freq: Once | ORAL | Status: AC
  Administered 2017-12-02: 4 mg via ORAL
  Filled 2017-12-02: qty 1

## 2017-12-02 NOTE — ED Notes (Signed)
Pt given water for PO challenge 

## 2017-12-02 NOTE — ED Notes (Signed)
Pt tolerating PO challenge.

## 2017-12-02 NOTE — ED Provider Notes (Signed)
[facility]ER[facility] DEPARTMENT Provider Note   CSN: [id number] Arrival date & time: 12/02/17  1211   History   Chief Complaint Chief Complaint  Patient presents with  . Dizziness  . Emesis    HPI Jacqueline Stephenson is a 77 y.o. female.  The history is provided by the patient.   77 yo F with PMHx of HTN, peripheral vertigo many years ago who presents with intermittent dizziness for past few days. States symptoms started a few days ago, resolved with her home vertigo exercises. Came to ED last week for elevated blood pressures, left before being seen by provider. Has seen her PCP since, who increased her lisinopril. States she has had intermittent N/V and room spinning dizziness for past 3 days, currently resolved. States she hasn't had vertigo in years, but was previously seen at vertigo clinic. States she does have a headache today, but believes this is secondary to not eating. She denies CP, SOB, abd pain. Denies fevers. Denies weakness, numbness, vision changes.   Past Medical History:  Diagnosis Date  . Diabetes mellitus without complication ([place])   . High cholesterol   . Hypertension   . Shingles   . Vertigo     There are no active problems to display for this patient.   Past Surgical History:  Procedure Laterality Date  . BREAST EXCISIONAL BIOPSY       OB History   None      Home Medications    Prior to Admission medications   Medication Sig Start Date End Date Taking? Authorizing Provider  aspirin EC 81 MG tablet Take 81 mg by mouth daily.   Yes [provider]  calcium carbonate (CALCIUM 600) 600 MG TABS tablet Take 600 mg by mouth daily.   Yes [provider]  calcium carbonate (TUMS - DOSED IN MG ELEMENTAL CALCIUM) 500 MG chewable tablet Chew 2-4 tablets by mouth as needed for indigestion or heartburn.   Yes [provider]  cholecalciferol (VITAMIN D) 1000 units tablet Take 2,000 Units by mouth daily.   Yes  [provider]  lisinopril (PRINIVIL,ZESTRIL) 20 MG tablet Take 20 mg by mouth daily.    Yes [provider]  Multiple Vitamin (MULTIVITAMIN WITH MINERALS) TABS tablet Take 1 tablet by mouth daily.   Yes [provider]  rosuvastatin (CRESTOR) 10 MG tablet Take 10 mg by mouth every evening. 11/26/17  Yes [provider]  vitamin E 100 UNIT capsule Take 100 Units by mouth daily.   Yes [provider]  meclizine (ANTIVERT) 25 MG tablet Take 1 tablet (25 mg total) by mouth 3 (three) times daily as needed for dizziness. 12/02/17   Norm Salt, MD  ondansetron (ZOFRAN ODT) 8 MG disintegrating tablet Take 1 tablet (8 mg total) by mouth every 8 (eight) hours as needed for nausea. Patient not taking: Reported on 12/02/2017 05/30/15   Varney Biles, MD  ondansetron (ZOFRAN) 4 MG tablet Take 1 tablet (4 mg total) by mouth every 8 (eight) hours as needed for up to 6 doses for nausea or vomiting. 12/02/17   Norm Salt, MD    Family History No family history on file.  Social History Social History   Tobacco Use  . Smoking status: Never Smoker  . Smokeless tobacco: Never Used  Substance Use Topics  . Alcohol use: Yes    Comment: Socially  . Drug use: Not Currently     Allergies   Oxycodone   Review of  Systems Review of Systems  Constitutional: Negative for chills and fever.  HENT: Negative for ear pain and sore throat.   Eyes: Negative for pain and visual disturbance.  Respiratory: Negative for cough and shortness of breath.   Cardiovascular: Negative for chest pain and palpitations.  Gastrointestinal: Positive for nausea and vomiting. Negative for abdominal pain.  Genitourinary: Negative for dysuria and hematuria.  Musculoskeletal: Negative for arthralgias and back pain.  Skin: Negative for color change and rash.  Neurological: Positive for dizziness and headaches. Negative for seizures, syncope and weakness.  All other systems reviewed and  are negative.    Physical Exam Updated Vital Signs BP (!) 162/98 (BP Location: Right Arm)   Pulse 77   Temp 97.9 F (36.6 C) (Oral)   Resp 16   SpO2 99%   Physical Exam  Constitutional: She is oriented to person, place, and time. She appears well-developed and well-nourished. No distress.  HENT:  Head: Normocephalic and atraumatic.  Mouth/Throat: Oropharynx is clear and moist.  Eyes: Pupils are equal, round, and reactive to light. Conjunctivae and EOM are normal.  Neck: Normal range of motion. Neck supple.  Cardiovascular: Normal rate, regular rhythm and intact distal pulses.  No murmur heard. Pulmonary/Chest: Effort normal and breath sounds normal. No stridor. No respiratory distress. She has no wheezes.  Abdominal: Soft. She exhibits no distension. There is no tenderness. There is no rebound and no guarding.  Musculoskeletal: She exhibits no edema.  Neurological: She is alert and oriented to person, place, and time. No cranial nerve deficit or sensory deficit. Coordination normal.  Elicited vertigo with Dix-Hallpike on right; CN II-XII intact, intact strength and sensation throughout, neg pronator drift, nl finger to nose bilaterally, nl heel to shin bilaterally, nl gait  Skin: Skin is warm and dry.  Psychiatric: She has a normal mood and affect.  Nursing note and vitals reviewed.    ED Treatments / Results  Labs (all labs ordered are listed, but only abnormal results are displayed) Labs Reviewed  BASIC METABOLIC PANEL - Abnormal; Notable for the following components:      Result Value   Glucose, Bld 122 (*)    Calcium 10.5 (*)    GFR calc non Af Amer 57 (*)    All other components within normal limits  URINALYSIS, ROUTINE W REFLEX MICROSCOPIC - Abnormal; Notable for the following components:   Leukocytes, UA TRACE (*)    Bacteria, UA RARE (*)    All other components within normal limits  CBC  CBG MONITORING, ED    EKG EKG Interpretation  Date/Time:  Monday  December 02 2017 12:38:57 EDT Ventricular Rate:  72 PR Interval:  168 QRS Duration: 90 QT Interval:  406 QTC Calculation: 444 R Axis:   51 Text Interpretation:  Normal sinus rhythm Cannot rule out Anterior infarct , age undetermined Abnormal ECG No significant change since last tracing Confirmed by Theotis Burrow [phone]) on 12/02/2017 5:35:29 PM   Radiology No results found.  Procedures Procedures (including critical care time)  Medications Ordered in ED Medications  ondansetron (ZOFRAN-ODT) disintegrating tablet 4 mg (4 mg Oral Given 12/02/17 1834)     Initial Impression / Assessment and Plan / ED Course  I have reviewed the triage vital signs and the nursing notes.  Pertinent labs & imaging results that were available during my care of the patient were reviewed by me and considered in my medical decision making (see chart for details).     Jacqueline Stephenson is  a 77 y.o. female with PMHx of  HTN, peripheral vertigo many years ago who presents with intermittent dizziness for past few days. Reviewed and confirmed nursing documentation for past medical history, family history, social history. VS afebrile, BP 151/72. Exam remarkable for +dix-hallpike to right, no neuro deficits, nl gait. C/w peripheral vertigo given h/o prior, exam findings. No other focal neuro deficits to suggest CVA. No AMS to suggest PRES in setting of elevated BP. Pt states she hasn't been able to take her increased dose of lisinopril consistently over last few days 2/2 symptoms. No indication for emergent imaging at this time.   EKG as above wnl. ODT zofran given with improvement in symptoms, able to tolerate PO, no longer has headache. Will take her home antihypertensives after discharge. CMP with hypercalcemia 10.5, otherwise unremarkable. CBC unremarkable. UA not c/w UTI.   Old records reviewed. Labs reviewed by me and used in the medical decision making. EKG reviewed by me and used in the medical decision making. D/c  home in stable condition, return precautions discussed. Patient, husband agreeable with plan for d/c home.   Final Clinical Impressions(s) / ED Diagnoses   Final diagnoses:  Vertigo    ED Discharge Orders        Ordered    ondansetron (ZOFRAN) 4 MG tablet  Every 8 hours PRN     12/02/17 2222    meclizine (ANTIVERT) 25 MG tablet  3 times daily PRN     12/02/17 2222       Norm Salt, MD 12/03/17 1537    Rex Kras, Wenda Overland, MD 12/06/17 [phone]

## 2017-12-02 NOTE — ED Triage Notes (Signed)
Patient to ED c/o dizziness, N/V x 1 week - was here last week, but left after triage d/t wait times. Patient states she has a history of vertigo and she has had "room spinning." Denies pain, no fevers/chills. Patient states the dizziness is worse with turning her head. Patient also states that she hasn't been able to keep her BP medications down d/t N/V. A&O x 4, no extremity weakness, neuro intact.

## 2017-12-09 ENCOUNTER — Other Ambulatory Visit: Payer: Self-pay

## 2017-12-09 ENCOUNTER — Ambulatory Visit: Payer: Medicare Other | Attending: Family Medicine | Admitting: Physical Therapy

## 2017-12-09 ENCOUNTER — Encounter: Payer: Self-pay | Admitting: Physical Therapy

## 2017-12-09 VITALS — BP 131/87 | HR 91

## 2017-12-09 DIAGNOSIS — R42 Dizziness and giddiness: Secondary | ICD-10-CM | POA: Diagnosis not present

## 2017-12-09 DIAGNOSIS — R2681 Unsteadiness on feet: Secondary | ICD-10-CM | POA: Diagnosis not present

## 2017-12-09 DIAGNOSIS — R262 Difficulty in walking, not elsewhere classified: Secondary | ICD-10-CM | POA: Diagnosis not present

## 2017-12-09 DIAGNOSIS — H8111 Benign paroxysmal vertigo, right ear: Secondary | ICD-10-CM | POA: Diagnosis not present

## 2017-12-09 NOTE — Therapy (Signed)
[facility] [address] [place], [place], [address] Phone: [phone]   Fax:  [phone]  Physical Therapy Evaluation  Patient Details  Name: Jacqueline Stephenson MRN: [id number] Date of Birth: 1941-02-20 Referring Provider: Gaynelle Arabian, MD   Encounter Date: 12/09/2017  PT End of Session - 12/09/17 2033    Visit Number  1    Number of Visits  7    Date for PT Re-Evaluation  02/07/18    Authorization Type  Medicare and AARP; VL: follow medicare guidelines    PT Start Time  1315    PT Stop Time  1407    PT Time Calculation (min)  52 min    Activity Tolerance  Other (comment) nausea and dizziness    Behavior During Therapy  Anxious       Past Medical History:  Diagnosis Date  . Diabetes mellitus without complication ([place])   . High cholesterol   . Hypertension   . Shingles   . Vertigo     Past Surgical History:  Procedure Laterality Date  . BREAST EXCISIONAL BIOPSY      Vitals:   12/09/17 1319  BP: 131/87  Pulse: 91     Subjective Assessment - 12/09/17 1319    Subjective  Pt presented to emergency room on 6/3 with severe dizziness, headaches and nausea.  Physician performed Epley maneuver with partial resolution of symptoms.  Pt continues to have dizziness and imbalance at home.  Pt has a history of vertigo - treated successfully at this facility.    Patient is accompained by:  Family member    Pertinent History  vertigo    Diagnostic tests  None    Patient Stated Goals  To get rid of dizziness completely    Currently in Pain?  No/denies wakes up with headache    Pain Score  0-No pain         [facility] PT Assessment - 12/09/17 1326      Assessment   Medical Diagnosis  vertigo    Referring Provider  Gaynelle Arabian, MD    Onset Date/Surgical Date  12/02/17    Prior Therapy  yes - [facility]      Precautions   Precautions  Fall;Other (comment)    Precaution  Comments  MONITOR BP; diabetes, high cholesterol, HTN, shingles and intermittent episodes of vertigo      Balance Screen   Has the patient fallen in the past 6 months  No      Prior Function   Level of Independence  Independent      Observation/Other Assessments   Focus on Therapeutic Outcomes (FOTO)   TBA tomorrow           Vestibular Assessment - 12/09/17 1329      Vestibular Assessment   General Observation  Has hearing aides but denies recent changes in hearing, denies changes in vision, denies tinnitus, denies recent infections or use of antibiotics      Symptom Behavior   Type of Dizziness  Spinning    Frequency of Dizziness  intermittent but daily    Duration of Dizziness  seconds    Aggravating Factors  Lying supine;Rolling to left    Relieving Factors  Closing eyes;Comments moving out of the position      Occulomotor Exam   Occulomotor Alignment  Normal    Spontaneous  Absent    Gaze-induced  Left beating nystagmus with L  gaze    Smooth Pursuits  Intact    Saccades  Slow;Poor trajectory;Comment increased blinking    Comment  convergence intact      Vestibulo-Occular Reflex   VOR to Slow Head Movement  Normal    VOR Cancellation  Normal    Comment  + to R; - to L indicating R hypofunction      Positional Testing   Dix-Hallpike  Dix-Hallpike Right;Dix-Hallpike Left    Sidelying Test  Sidelying Right;Sidelying Left    Horizontal Canal Testing  Horizontal Canal Right;Horizontal Canal Left      Dix-Hallpike Right   Dix-Hallpike Right Duration  0    Dix-Hallpike Right Symptoms  No nystagmus dizziness coming back up to sitting      Dix-Hallpike Left   Dix-Hallpike Left Duration  0    Dix-Hallpike Left Symptoms  No nystagmus dizziness coming back up to sitting      Sidelying Right   Sidelying Right Duration  30 seconds    Sidelying Right Symptoms  No nystagmus      Sidelying Left   Sidelying Left Duration  0    Sidelying Left Symptoms  No nystagmus       Horizontal Canal Right   Horizontal Canal Right Duration  0    Horizontal Canal Right Symptoms  Normal      Horizontal Canal Left   Horizontal Canal Left Duration  0    Horizontal Canal Left Symptoms  Normal          Objective measurements completed on examination: See above findings.              PT Education - 12/09/17 2032    Education Details  clinical findings, PT POC and goals    Person(s) Educated  Patient;Spouse    Methods  Explanation    Comprehension  Verbalized understanding       PT Short Term Goals - 12/09/17 2041      PT SHORT TERM GOAL #1   Title  = LTG        PT Long Term Goals - 12/09/17 2041      PT LONG TERM GOAL #1   Title  Pt will be independent with vestibular and balance HEP    Time  8    Period  Weeks    Status  New    Target Date  02/07/18      PT LONG TERM GOAL #2   Title  Pt will demonstrate negative positional testing and report 0/10 dizziness with supine <> sit    Time  8    Period  Weeks    Status  New    Target Date  02/07/18      PT LONG TERM GOAL #3   Title  Pt will improve DVA to 2-3 line difference to indicate improved use of VOR    Baseline  TBD    Time  8    Period  Weeks    Status  New    Target Date  02/07/18      PT LONG TERM GOAL #4   Title  Pt will improve gait velocity to >/= 2.62 to indicate decreased falls risk in community    Baseline  TBD    Time  8    Period  Weeks    Status  New    Target Date  02/07/18      PT LONG TERM GOAL #5   Title  Pt will improve overall  function as indicated by FOTO by 10 points    Baseline  TBD    Time  8    Period  Weeks    Status  New    Target Date  02/07/18             Plan - 12/09/17 2035    Clinical Impression Statement  Pt is a 77 year old female referred to [facility] for evaluation of vertigo.  Pt's PMH is significant for the following: diabetes, high cholesterol, HTN, shingles and intermittent episodes of vertigo. The following deficits were  noted during pt's exam: subjective symptoms of vertigo during positional testing but clear directional nystagmus was not observed during testing in room light; further evaluation with frenzel lenses needed to rule out BPPV; pt also presents with disequilibrium, impaired VOR with + head impulse test to R indicating R vestibular hypofunction and impaired balance placing pt at increased risk for falls.  Pt would benefit from skilled PT to address these impairments and functional limitations to maximize functional mobility independence and reduce falls risk.    History and Personal Factors relevant to plan of care:  history of vertigo, HTN, DM, high cholesterol, increased falls risk due to vertigo    Clinical Presentation  Stable    Clinical Presentation due to:  history of vertigo, HTN, DM, high cholesterol, increased falls risk due to vertigo    Clinical Decision Making  Low    Rehab Potential  Good    PT Frequency  Other (comment) 2-3x this week and then 1x/week x 4 weeks beginning in July    PT Duration  8 weeks    PT Treatment/Interventions  ADLs/Self Care Home Management;Canalith Repostioning;Gait training;Functional mobility training;Therapeutic activities;Therapeutic exercise;Balance training;Neuromuscular re-education;Patient/family education;Vestibular    PT Next Visit Plan  NEEDS TO DO FOTO BEFORE OR AFTER VISIT; Appears to have two things going on: partially resolved BPPV but nystagmus was unclear - may need to use frenzels but based on her symptoms it sounded like R posterior canal??  Treat if indicated or teach Brandt-Daroff for habituation.  Also has R hypofunction.  Please assess DVA and teach x 1 viewing; Please assess gait velocity.      Consulted and Agree with Plan of Care  Patient;Family member/caregiver    Family Member Consulted  husband       Patient will benefit from skilled therapeutic intervention in order to improve the following deficits and impairments:  Decreased balance,  Dizziness, Pain, Difficulty walking  Visit Diagnosis: BPPV (benign paroxysmal positional vertigo), right  Dizziness and giddiness  Unsteadiness on feet  Difficulty in walking, not elsewhere classified     Problem List There are no active problems to display for this patient.   Rico Junker, PT, DPT 12/09/17    8:49 PM    [facility] [address] [place], [place], [address] Phone: [phone]   Fax:  [phone]  Name: Jacqueline Stephenson MRN: [id number] Date of Birth: 1941-05-27

## 2017-12-10 ENCOUNTER — Encounter: Payer: Self-pay | Admitting: Physical Therapy

## 2017-12-10 ENCOUNTER — Ambulatory Visit: Payer: Medicare Other | Admitting: Physical Therapy

## 2017-12-10 DIAGNOSIS — R262 Difficulty in walking, not elsewhere classified: Secondary | ICD-10-CM | POA: Diagnosis not present

## 2017-12-10 DIAGNOSIS — H8111 Benign paroxysmal vertigo, right ear: Secondary | ICD-10-CM | POA: Diagnosis not present

## 2017-12-10 DIAGNOSIS — R2681 Unsteadiness on feet: Secondary | ICD-10-CM | POA: Diagnosis not present

## 2017-12-10 DIAGNOSIS — R42 Dizziness and giddiness: Secondary | ICD-10-CM | POA: Diagnosis not present

## 2017-12-10 NOTE — Therapy (Signed)
[facility] [address] [place], [place], [address] Phone: [phone]   Fax:  [phone]  Physical Therapy Treatment  Patient Details  Name: Jacqueline Stephenson MRN: [id number] Date of Birth: 06/24/1941 Referring Provider: Gaynelle Arabian, MD   Encounter Date: 12/10/2017  PT End of Session - 12/10/17 1939    Visit Number  2    Number of Visits  7    Date for PT Re-Evaluation  02/07/18    Authorization Type  Medicare and AARP; VL: follow medicare guidelines    PT Start Time  1101    PT Stop Time  1147    PT Time Calculation (min)  46 min       Past Medical History:  Diagnosis Date  . Diabetes mellitus without complication ([place])   . High cholesterol   . Hypertension   . Shingles   . Vertigo     Past Surgical History:  Procedure Laterality Date  . BREAST EXCISIONAL BIOPSY      There were no vitals filed for this visit.  Subjective Assessment - 12/10/17 1936    Subjective  Pt reports she was able to lie on her left side last night for first time in several months; states she got dizzy when she looked down this morning. Pt states she feels more dizzy right now if she does something than she did yesterday, but "I had a wonderful night's sleep last night"      Pertinent History  vertigo    Patient Stated Goals  To get rid of dizziness completely    Currently in Pain?  No/denies         Pt had (-) Lt Dix-Hallpike test  - no nystagmus and no c/o vertigo  (+) Rt Dix-Hallpike test with Rt rotary upbeating nystagmus and c/o vertigo - indicative of Rt BPPV    Self Care- educated pt in etiology of BPPV; recommended pt to drink lots of water and to stay hydrated; pt  Declined info on BPPV    Rt and Lt sidelying tests (-) with no nystagmus and no c/o vertigo in test positions Pt did c/o some light-headedness/ unsteadiness with return to upright sitting positions      Vestibular Treatment/Exercise -  12/10/17 0001      Vestibular Treatment/Exercise   Vestibular Treatment Provided  Canalith Repositioning    Canalith Repositioning  Epley Manuever Right       EPLEY MANUEVER RIGHT   Number of Reps   3    Overall Response  Improved Symptoms    Response Details   No nystagmus noted on 2nd or 3rd rep : pt reported no vertigo in any position of 3rd rep             PT Education - 12/10/17 1937    Education Details  offered pt info on BPPV etiology but pt declined - states she has had PT before at this facility (approx. 4 yrs ago) and didn't think the info was needed    Person(s) Educated  Patient    Methods  Explanation    Comprehension  Verbalized understanding       PT Short Term Goals - 12/09/17 2041      PT SHORT TERM GOAL #1   Title  = LTG        PT Long Term Goals - 12/09/17 2041      PT LONG TERM GOAL #1   Title  Pt will be independent with  vestibular and balance HEP    Time  8    Period  Weeks    Status  New    Target Date  02/07/18      PT LONG TERM GOAL #2   Title  Pt will demonstrate negative positional testing and report 0/10 dizziness with supine <> sit    Time  8    Period  Weeks    Status  New    Target Date  02/07/18      PT LONG TERM GOAL #3   Title  Pt will improve DVA to 2-3 line difference to indicate improved use of VOR    Baseline  TBD    Time  8    Period  Weeks    Status  New    Target Date  02/07/18      PT LONG TERM GOAL #4   Title  Pt will improve gait velocity to >/= 2.62 to indicate decreased falls risk in community    Baseline  TBD    Time  8    Period  Weeks    Status  New    Target Date  02/07/18      PT LONG TERM GOAL #5   Title  Pt will improve overall function as indicated by FOTO by 10 points    Baseline  TBD    Time  8    Period  Weeks    Status  New    Target Date  02/07/18            Plan - 12/10/17 1939    Clinical Impression Statement  Pt had (+) Rt Dix-Hallpike test with Rt rotary upbeating  nystagmus, indicative of Rt posterior canalithiasis; symptoms much improved with Epley maneuver x 3 reps    Rehab Potential  Good    PT Frequency  Other (comment)    PT Duration  8 weeks    PT Treatment/Interventions  ADLs/Self Care Home Management;Canalith Repostioning;Gait training;Functional mobility training;Therapeutic activities;Therapeutic exercise;Balance training;Neuromuscular re-education;Patient/family education;Vestibular    PT Next Visit Plan  reassess Rt BPPV: tx prn:  give Brandt-Daroff if needed:   Per Audra -- assess DVA, teach x1 viewing and assess gait velocity    Consulted and Agree with Plan of Care  Patient       Patient will benefit from skilled therapeutic intervention in order to improve the following deficits and impairments:  Decreased balance, Dizziness, Pain, Difficulty walking  Visit Diagnosis: BPPV (benign paroxysmal positional vertigo), right     Problem List There are no active problems to display for this patient.   Alda Lea, PT 12/10/2017, 7:48 PM  [facility] [address] [place], [place], [address] Phone: [phone]   Fax:  [phone]  Name: Jacqueline Stephenson MRN: [id number] Date of Birth: 14-Dec-1940

## 2017-12-11 ENCOUNTER — Ambulatory Visit: Payer: Medicare Other | Admitting: Physical Therapy

## 2017-12-11 ENCOUNTER — Encounter: Payer: Self-pay | Admitting: Physical Therapy

## 2017-12-11 DIAGNOSIS — R2681 Unsteadiness on feet: Secondary | ICD-10-CM

## 2017-12-11 DIAGNOSIS — H8111 Benign paroxysmal vertigo, right ear: Secondary | ICD-10-CM | POA: Diagnosis not present

## 2017-12-11 DIAGNOSIS — R42 Dizziness and giddiness: Secondary | ICD-10-CM

## 2017-12-11 DIAGNOSIS — R262 Difficulty in walking, not elsewhere classified: Secondary | ICD-10-CM

## 2017-12-11 NOTE — Therapy (Signed)
[facility] [address] [place], [place], [address] Phone: [phone]   Fax:  [phone]  Physical Therapy Treatment  Patient Details  Name: Jacqueline Stephenson MRN: [id number] Date of Birth: 06-01-1941 Referring Provider: Gaynelle Arabian, MD   Encounter Date: 12/11/2017  PT End of Session - 12/11/17 1643    Visit Number  3    Number of Visits  7    Date for PT Re-Evaluation  02/07/18    Authorization Type  Medicare and AARP; VL: follow medicare guidelines    PT Start Time  1017    PT Stop Time  1102    PT Time Calculation (min)  45 min    Activity Tolerance  Patient tolerated treatment well;Other (comment) some incr in unsteadiness, but not dizziness    Behavior During Therapy  WFL for tasks assessed/performed       Past Medical History:  Diagnosis Date  . Diabetes mellitus without complication ([place])   . High cholesterol   . Hypertension   . Shingles   . Vertigo     Past Surgical History:  Procedure Laterality Date  . BREAST EXCISIONAL BIOPSY      There were no vitals filed for this visit.  Subjective Assessment - 12/11/17 1018    Subjective  Clarifies that she had the spinning dizziness for several months whenever she rolled onto her side. Reports no dizziness going to bed last night. Did have dizziness this morning when she was walking to bathroom and looked down (felt "weird" but not spinning). Her balance is better but not normal. She noticed especailly was off as she walked to the bathroom.     Pertinent History  vertigo    Patient Stated Goals  To get rid of dizziness completely    Currently in Pain?  No/denies             Vestibular Assessment - 12/11/17 1635      Vestibular Assessment   General Observation  walks with wide BOS and staggering steps at times      Symptom Behavior   Type of Dizziness  Imbalance    Frequency of Dizziness  intermittent but daily    Duration of Dizziness   seconds    Aggravating Factors  Comment looking down    Relieving Factors  Comments raise head to neutral      Visual Acuity   Static  8    Dynamic  3      Dix-Hallpike Right   Dix-Hallpike Right Duration  5 second delay, dizziness x 5 seconds (denied spinning) "weird...looks like floor is moving"   Dix-Hallpike Right Symptoms  No nystagmus                Vestibular Treatment/Exercise - 12/11/17 1638      Vestibular Treatment/Exercise   Vestibular Treatment Provided  Canalith Repositioning;Gaze;Habituation    Canalith Repositioning  Epley Manuever Right    Habituation Exercises  Nestor Lewandowsky    Gaze Exercises  X1 Viewing Horizontal;X1 Viewing Vertical       EPLEY MANUEVER RIGHT   Number of Reps   2    Overall Response  Improved Symptoms    Response Details   pt reported dizziness lasting seconds on steps 1 and 3 during first Epley and no dizziness during second      [employer]   Number of Reps   1    Symptom Description   no symptoms, but exercise hard on  her shoulders, did not continue      X1 Viewing Horizontal   Foot Position  feet apart, feet together    Time  -- 30 sec    Reps  3    Comments  denied dizziness but becomees off balance with incr sway after 30 sec      X1 Viewing Vertical   Foot Position  feet apart, feet together    Time  -- 30 sec    Reps  2    Comments  milder imbalance than with horizontal, however present            PT Education - 12/11/17 1641    Education Details  what a hypofunction is and how it probably relates to BPPV; initiated HEP     Person(s) Educated  Patient    Methods  Explanation;Demonstration;Verbal cues;Handout    Comprehension  Verbalized understanding;Returned demonstration;Verbal cues required;Need further instruction       PT Short Term Goals - 12/09/17 2041      PT SHORT TERM GOAL #1   Title  = LTG        PT Long Term Goals - 12/09/17 2041      PT LONG TERM GOAL #1   Title  Pt will be  independent with vestibular and balance HEP    Time  8    Period  Weeks    Status  New    Target Date  02/07/18      PT LONG TERM GOAL #2   Title  Pt will demonstrate negative positional testing and report 0/10 dizziness with supine <> sit    Time  8    Period  Weeks    Status  New    Target Date  02/07/18      PT LONG TERM GOAL #3   Title  Pt will improve DVA to 2-3 line difference to indicate improved use of VOR    Baseline  TBD    Time  8    Period  Weeks    Status  New    Target Date  02/07/18      PT LONG TERM GOAL #4   Title  Pt will improve gait velocity to >/= 2.62 to indicate decreased falls risk in community    Baseline  TBD    Time  8    Period  Weeks    Status  New    Target Date  02/07/18      PT LONG TERM GOAL #5   Title  Pt will improve overall function as indicated by FOTO by 10 points    Baseline  52% function (48% limited)    Time  8    Period  Weeks    Status  New    Target Date  02/07/18            Plan - 12/11/17 1644    Clinical Impression Statement  Patient with no nystagmus or reports of spinning dizziness with Dix-Hallpike, but did have 5 seconds of dizziness that she could only describe as "weird" and in some Stephenson the floor looked like it was moving. Completed Epley x 2 with symptoms resolved/not present on 2nd rep (see note for further details). Began to educate on doing Brandt-Daroff, however pt with great difficulty achieving correct head position in sidelying due to bil shoulder pain. She did not have any symptoms and decided to defer this exercise. Dynamic visual acuity with 5 line difference and educated in  these results and initiated VORx1 ex's for HEP. Patient has only one more appointment (later next week due to high pt census) prior to her trip to [place]. Educated in how to advance difficulty of VORx1 if needed.     Rehab Potential  Good    PT Frequency  Other (comment)    PT Duration  8 weeks    PT Treatment/Interventions   ADLs/Self Care Home Management;Canalith Repostioning;Gait training;Functional mobility training;Therapeutic activities;Therapeutic exercise;Balance training;Neuromuscular re-education;Patient/family education;Vestibular    PT Next Visit Plan  Assess VORx1 exercises and progress as indicated; measure gait velocity and update goal if not appropriate; initiate balance exercises (currently has only one visit prior to leaving for [place])**Note, pt very upset that she spent time doing FOTO as it "has nothing to do with why I'm here." Noticed a goal is set to measure FOTO--just may want to reconsider this given her strong reaction.    Consulted and Agree with Plan of Care  Patient       Patient will benefit from skilled therapeutic intervention in order to improve the following deficits and impairments:  Decreased balance, Dizziness, Pain, Difficulty walking  Visit Diagnosis: Dizziness and giddiness  Unsteadiness on feet  Difficulty in walking, not elsewhere classified     Problem List There are no active problems to display for this patient.   Rexanne Mano, PT 12/11/2017, 4:57 PM  [facility] [address] [place], [place], [address] Phone: [phone]   Fax:  [phone]  Name: CAMAY PEDIGO MRN: [id number] Date of Birth: 05-03-1941

## 2017-12-11 NOTE — Patient Instructions (Signed)
Gaze Stabilization: Tip Card  1.Target must remain in focus, not blurry, and appear stationary while head is in motion. 2.Perform exercises with small head movements (45 to either side of midline). 3.Increase speed of head motion so long as target is in focus. 4.If you wear eyeglasses, be sure you can see target through lens (therapist will give specific instructions for bifocal / progressive lenses). 5.These exercises may provoke dizziness or nausea. Work through these symptoms. If too dizzy, slow head movement slightly. Rest between each exercise. 6.Exercises demand concentration; avoid distractions.  Copyright  VHI. All rights reserved.     Special Instructions: Exercises may bring on mild to moderate symptoms of dizziness that resolve within 30 minutes of completing exercises. If symptoms are lasting longer than 30 minutes, modify your exercises by:  >decreasing the # of times you complete each activity >ensuring your symptoms return to baseline before moving onto the next exercise >dividing up exercises so you do not do them all in one session, but multiple short sessions throughout the day >doing them once a day until symptoms improve   Gaze Stabilization: Sitting    Keeping eyes on target on wall 3 feet away, and move head side to side for _30-60___ seconds. Repeat while moving head up and down for __30-60__ seconds. Do __2-3__ sessions per day.  Copyright  VHI. All rights reserved.    Gaze Stabilization - Tip Card  For safety, perform standing exercises close to a counter, wall, corner, or next to someone.   Gaze Stabilization - Standing Feet Apart   Feet shoulder width apart, keeping eyes on target on wall 3 feet away, tilt head down slightly and move head side to side for 30 seconds. Repeat while moving head up and down for 30 seconds. *Work up to tolerating 60 seconds, as able. Do 2-3 sessions per day.   Copyright  VHI. All rights reserved.

## 2017-12-12 DIAGNOSIS — H16141 Punctate keratitis, right eye: Secondary | ICD-10-CM | POA: Diagnosis not present

## 2017-12-12 DIAGNOSIS — H00021 Hordeolum internum right upper eyelid: Secondary | ICD-10-CM | POA: Diagnosis not present

## 2017-12-12 DIAGNOSIS — H11121 Conjunctival concretions, right eye: Secondary | ICD-10-CM | POA: Diagnosis not present

## 2017-12-16 ENCOUNTER — Encounter: Payer: Self-pay | Admitting: Physical Therapy

## 2017-12-16 ENCOUNTER — Ambulatory Visit: Payer: Medicare Other | Admitting: Physical Therapy

## 2017-12-16 DIAGNOSIS — R42 Dizziness and giddiness: Secondary | ICD-10-CM

## 2017-12-16 DIAGNOSIS — H8111 Benign paroxysmal vertigo, right ear: Secondary | ICD-10-CM | POA: Diagnosis not present

## 2017-12-16 DIAGNOSIS — R2681 Unsteadiness on feet: Secondary | ICD-10-CM | POA: Diagnosis not present

## 2017-12-16 DIAGNOSIS — R262 Difficulty in walking, not elsewhere classified: Secondary | ICD-10-CM

## 2017-12-16 NOTE — Therapy (Signed)
[facility] [address] [place], [place], [address] Phone: [phone]   Fax:  [phone]  Physical Therapy Treatment  Patient Details  Name: Jacqueline Stephenson MRN: [id number] Date of Birth: [birth year] Referring Provider: Gaynelle Arabian, MD   Encounter Date: 12/16/2017  PT End of Session - 12/16/17 2137    Visit Number  4    Number of Visits  7    Date for PT Re-Evaluation  02/07/18    Authorization Type  Medicare and AARP; VL: follow medicare guidelines - route 10th note to MD for PN    PT Start Time  0854    PT Stop Time  0938    PT Time Calculation (min)  44 min    Activity Tolerance  Patient tolerated treatment well;Other (comment) some incr in unsteadiness, but not dizziness    Behavior During Therapy  WFL for tasks assessed/performed       Past Medical History:  Diagnosis Date  . Diabetes mellitus without complication ([place])   . High cholesterol   . Hypertension   . Shingles   . Vertigo     Past Surgical History:  Procedure Laterality Date  . BREAST EXCISIONAL BIOPSY      There were no vitals filed for this visit.  Subjective Assessment - 12/16/17 0857    Subjective  No spinning dizziness, balance is better but still a little off.  Ready to go to [place].    Pertinent History  vertigo    Patient Stated Goals  To get rid of dizziness completely    Currently in Pain?  No/denies         [facility] PT Assessment - 12/16/17 0859      Standardized Balance Assessment   Standardized Balance Assessment  10 meter walk test    10 Meter Walk  7.72 seconds or 4.24 ft/sec                    Vestibular Treatment/Exercise - 12/16/17 0915      Vestibular Treatment/Exercise   Gaze Exercises  X1 Viewing Horizontal;X1 Viewing Vertical      X1 Viewing Horizontal   Foot Position  feet apart    Reps  2    Comments  30 > 45 seconds, cues needed for technique      X1 Viewing Vertical   Foot  Position  feet apart    Reps  2    Comments  30 > 45 seconds, cues for technique         Balance Exercises - 12/16/17 2157      Balance Exercises: Standing   Gait with Head Turns  Forward;Upper extremity support;4 reps at countertop; 10 reps head nods/turns    Other Standing Exercises  Walking forwards and backwards with eyes closed and one hand on counter top; 4 reps        PT Education - 12/16/17 2130    Education Details  reviewed technique for x 1 viewing, reviewed ways to progress exercise, added to standing balance HEP    Person(s) Educated  Patient    Methods  Explanation;Demonstration;Handout    Comprehension  Verbalized understanding;Returned demonstration       PT Short Term Goals - 12/09/17 2041      PT SHORT TERM GOAL #1   Title  = LTG        PT Long Term Goals - 12/16/17 2153      PT LONG TERM GOAL #1  Title  Pt will be independent with vestibular and balance HEP    Time  8    Period  Weeks    Status  New    Target Date  02/07/18      PT LONG TERM GOAL #2   Title  Pt will demonstrate negative positional testing and report 0/10 dizziness with supine <> sit    Time  8    Period  Weeks    Status  New    Target Date  02/07/18      PT LONG TERM GOAL #3   Title  Pt will improve DVA to 3 line difference to indicate improved use of VOR    Baseline  8, 3 (5 line difference)    Time  8    Period  Weeks    Status  Revised    Target Date  02/07/18      PT LONG TERM GOAL #4   Title  Pt will improve gait velocity to >/= 2.62 to indicate decreased falls risk in community    Baseline  4 ft/sec    Time  8    Period  Weeks    Status  Achieved      PT LONG TERM GOAL #5   Title  Pt will improve overall function as indicated by FOTO by 10 points    Baseline  52% function (48% limited)    Time  8    Period  Weeks    Status  New    Target Date  02/07/18            Plan - 12/16/17 2138    Clinical Impression Statement  Pt continues to experience  resolution of canalithiasis but continues to have onoing symptoms of hypofunction.  Performed assessment of patient's gait velocity which was WNL for independent community dwelling adult today likely due to resolution of spinning vertigo.  Rest of session focused on review of gaze adaptation and how to progress while out of town.  Also added standing balance exercises at counter top.  Pt tolerated well and will return for one more visit before leaving for [place].  Will continue to progress towards LTG.    Rehab Potential  Good    PT Frequency  Other (comment)    PT Duration  8 weeks    PT Treatment/Interventions  ADLs/Self Care Home Management;Canalith Repostioning;Gait training;Functional mobility training;Therapeutic activities;Therapeutic exercise;Balance training;Neuromuscular re-education;Patient/family education;Vestibular    PT Next Visit Plan  Continue to add standing balance exercises - corner balance and update HEP.      Consulted and Agree with Plan of Care  Patient       Patient will benefit from skilled therapeutic intervention in order to improve the following deficits and impairments:  Decreased balance, Dizziness, Pain, Difficulty walking  Visit Diagnosis: Dizziness and giddiness  Unsteadiness on feet  Difficulty in walking, not elsewhere classified     Problem List There are no active problems to display for this patient.  Rico Junker, PT, DPT 12/16/17    9:58 PM    [facility] [address] [place], [place], [address] Phone: [phone]   Fax:  [phone]  Name: Jacqueline Stephenson MRN: [id number] Date of Birth: 1941/04/01

## 2017-12-16 NOTE — Patient Instructions (Addendum)
TO PROGRESS LETTER EXERCISE:  - Gradually increase time by 15 seconds every 3 to 5 days.  (45 > 60 > 1:15 > 1:30 > 2:00 minutes)  - Progress from feet hip distance apart to feet together.  Gradually increase the time again.   When you can perform feet together for 2 minutes.  Try feet staggered, right foot forwards - 1 minute each.   Left foot forwards - 1 minute each.  Work back up to 2 minutes.   For Later:  - Progress from standing on a solid floor > standing feet apart on a pillow or folded blanket.    - Put letter on a busy background and start over on solid ground, feet apart.     Walking    Walk on solid surface with hand close to support, wall or counter. With eyes closed, walk forwards and backwards slowly keeping hand on counter top and trying to walk in a straight line.  Repeat for 4 laps.  2 times a day.    Random Direction Head Motion    Walking on solid surface, with one hand on counter top: move head and eyes in left to right directions every __3__ steps.  Walking forwards and backwards.  Perform 4 laps.  Repeat moving head up to down for 4 laps. Perform 2 times a day.

## 2017-12-19 ENCOUNTER — Ambulatory Visit: Payer: Medicare Other | Admitting: Physical Therapy

## 2017-12-19 DIAGNOSIS — R42 Dizziness and giddiness: Secondary | ICD-10-CM | POA: Diagnosis not present

## 2017-12-19 DIAGNOSIS — H53121 Transient visual loss, right eye: Secondary | ICD-10-CM | POA: Diagnosis not present

## 2017-12-19 DIAGNOSIS — H8111 Benign paroxysmal vertigo, right ear: Secondary | ICD-10-CM

## 2017-12-19 DIAGNOSIS — R262 Difficulty in walking, not elsewhere classified: Secondary | ICD-10-CM | POA: Diagnosis not present

## 2017-12-19 DIAGNOSIS — R2681 Unsteadiness on feet: Secondary | ICD-10-CM | POA: Diagnosis not present

## 2017-12-19 NOTE — Patient Instructions (Addendum)
Sit to Side-Lying    Sit on edge of bed. 1. Turn head 45 to right. 2. Maintain head position and lie down slowly on left side. Hold until symptoms subside. 3. Sit up slowly. Hold until symptoms subside. 4. Turn head 45 to left. 5. Maintain head position and lie down slowly on right side. Hold until symptoms subside. 6. Sit up slowly. Repeat sequence _5___ times per session. Do ___3_ sessions per day.  LIE DOWN ON RIGHT SIDE FIRST!! Feet Apart (Compliant Surface) Head Motion - Eyes Closed    Stand on compliant surface: _pillow _______ with feet shoulder width apart. Close eyes and move head slowly, up and down. Repeat __10__ times per session. Do __1-2__ sessions per day.  ALSO stand with feet together - eyes open/closed

## 2017-12-20 ENCOUNTER — Encounter: Payer: Self-pay | Admitting: Physical Therapy

## 2017-12-20 ENCOUNTER — Encounter

## 2017-12-20 NOTE — Therapy (Signed)
[facility] [address] [place], [place], [address] Phone: [phone]   Fax:  [phone]  Physical Therapy Treatment  Patient Details  Name: Jacqueline Stephenson MRN: [id number] Date of Birth: 16-Sep-1940 Referring Provider: Gaynelle Arabian, MD   Encounter Date: 12/19/2017  PT End of Session - 12/20/17 1826    Visit Number  5    Number of Visits  7    Date for PT Re-Evaluation  02/07/18    Authorization Type  Medicare and AARP; VL: follow medicare guidelines - route 10th note to MD for PN    PT Start Time  1103    PT Stop Time  1146    PT Time Calculation (min)  43 min       Past Medical History:  Diagnosis Date  . Diabetes mellitus without complication ([place])   . High cholesterol   . Hypertension   . Shingles   . Vertigo     Past Surgical History:  Procedure Laterality Date  . BREAST EXCISIONAL BIOPSY      There were no vitals filed for this visit.  Subjective Assessment - 12/20/17 1812    Subjective  Pt reports she thinks the vertigo is gone; is now having a problem with her eye - "white spots on the bottom lid" - states she has appt with eye doctor after PT appt today    Patient Stated Goals  To get rid of dizziness completely    Currently in Pain?  No/denies       NeuroRe-ed:   Rt and Lt Dix-Hallpike tests (-) with no nystagmus and no c/o vertigo   Rt and Lt sidelying tests (-) with no nystagmus and no c/o vertigo  Pt performed x1 viewing horizontal and vertical 60 secs each direction in standing - no symptoms provoked with this exercise  Pt instructed in standing on foam with EO and EC with head turns for HEP: Pt performed standing on pillows in corner - feet apart - with EO with targets side to side and up/down: targets up/down 5 reps each; then with EC with head turns Horizontal and vertical 5 reps each Pt performed standing on pillows in corner - feet together - EO and EC with head turns   Pt  also performed tandem stance 30 secs with UE support prn; SLS each leg 10 secs with UE support                      PT Education - 12/20/17 1816    Education Details  Brandt-Daroff exercises; eyes open/closed with feet apart/together    Person(s) Educated  Patient    Methods  Explanation;Demonstration;Handout    Comprehension  Verbalized understanding;Returned demonstration       PT Short Term Goals - 12/09/17 2041      PT SHORT TERM GOAL #1   Title  = LTG        PT Long Term Goals - 12/16/17 2153      PT LONG TERM GOAL #1   Title  Pt will be independent with vestibular and balance HEP    Time  8    Period  Weeks    Status  New    Target Date  02/07/18      PT LONG TERM GOAL #2   Title  Pt will demonstrate negative positional testing and report 0/10 dizziness with supine <> sit    Time  8    Period  Weeks    Status  New    Target Date  02/07/18      PT LONG TERM GOAL #3   Title  Pt will improve DVA to 3 line difference to indicate improved use of VOR    Baseline  8, 3 (5 line difference)    Time  8    Period  Weeks    Status  Revised    Target Date  02/07/18      PT LONG TERM GOAL #4   Title  Pt will improve gait velocity to >/= 2.62 to indicate decreased falls risk in community    Baseline  4 ft/sec    Time  8    Period  Weeks    Status  Achieved      PT LONG TERM GOAL #5   Title  Pt will improve overall function as indicated by FOTO by 10 points    Baseline  52% function (48% limited)    Time  8    Period  Weeks    Status  New    Target Date  02/07/18            Plan - 12/20/17 1831    Clinical Impression Statement  Pt's BPPV appears to be fully resolved but pt demonstrates mild vestibular hypofunction as evidenced by unsteadiness/LOB with standing on compliant surface with EC.    Rehab Potential  Good    PT Frequency  Other (comment)    PT Duration  8 weeks    PT Treatment/Interventions  ADLs/Self Care Home  Management;Canalith Repostioning;Gait training;Functional mobility training;Therapeutic activities;Therapeutic exercise;Balance training;Neuromuscular re-education;Patient/family education;Vestibular    PT Next Visit Plan  check balance HEP (balance on foam)- reassess vertigo prn;     PT Home Exercise Plan  balance on foam; tandem stance and SLS    Consulted and Agree with Plan of Care  Patient       Patient will benefit from skilled therapeutic intervention in order to improve the following deficits and impairments:  Decreased balance, Dizziness, Pain, Difficulty walking  Visit Diagnosis: Unsteadiness on feet  BPPV (benign paroxysmal positional vertigo), right     Problem List There are no active problems to display for this patient.   Alda Lea, PT 12/20/2017, 6:41 PM  [facility] [address] [place], [place], [address] Phone: [phone]   Fax:  [phone]  Name: Jacqueline Stephenson MRN: [id number] Date of Birth: 1940/09/25

## 2017-12-23 DIAGNOSIS — Z961 Presence of intraocular lens: Secondary | ICD-10-CM | POA: Diagnosis not present

## 2017-12-23 DIAGNOSIS — B308 Other viral conjunctivitis: Secondary | ICD-10-CM | POA: Diagnosis not present

## 2017-12-23 DIAGNOSIS — H2512 Age-related nuclear cataract, left eye: Secondary | ICD-10-CM | POA: Diagnosis not present

## 2017-12-25 DIAGNOSIS — B308 Other viral conjunctivitis: Secondary | ICD-10-CM | POA: Diagnosis not present

## 2018-01-14 ENCOUNTER — Ambulatory Visit: Payer: Medicare Other | Attending: Family Medicine | Admitting: Physical Therapy

## 2018-01-14 ENCOUNTER — Encounter: Payer: Self-pay | Admitting: Physical Therapy

## 2018-01-14 VITALS — BP 121/84

## 2018-01-14 DIAGNOSIS — R42 Dizziness and giddiness: Secondary | ICD-10-CM | POA: Diagnosis not present

## 2018-01-14 DIAGNOSIS — R2681 Unsteadiness on feet: Secondary | ICD-10-CM

## 2018-01-14 NOTE — Patient Instructions (Signed)
Tandem Stance    Right foot in front of left, heel touching toe both feet "straight ahead". Stand on Foot Triangle of Support with both feet. Balance in this position _30__ seconds. Do with left foot in front of right.    HOLD 30 secs each position  - hold counter as needed   SINGLE LIMB STANCE    Stance: single leg on floor. Raise leg. Hold _10__ seconds. Repeat with other leg. _1-2__ reps per set, _1__ sets per day, 5__ days per week  Copyright  VHI. All rights reserved.

## 2018-01-15 ENCOUNTER — Encounter: Payer: Self-pay | Admitting: Physical Therapy

## 2018-01-15 NOTE — Therapy (Addendum)
[facility] [address] [place], [place], [address] Phone: [phone]   Fax:  [phone]  Physical Therapy Treatment  Patient Details  Name: Jacqueline Stephenson MRN: [id number] Date of Birth: 1940-09-18 Referring Provider: Gaynelle Arabian, MD   Encounter Date: 01/14/2018  PT End of Session - 01/15/18 2057    Visit Number  6    Number of Visits  7    Date for PT Re-Evaluation  02/07/18    Authorization Type  Medicare and AARP; VL: follow medicare guidelines - route 10th note to MD for PN    PT Start Time  1017    PT Stop Time  1100    PT Time Calculation (min)  43 min       Past Medical History:  Diagnosis Date  . Diabetes mellitus without complication ([place])   . High cholesterol   . Hypertension   . Shingles   . Vertigo     Past Surgical History:  Procedure Laterality Date  . BREAST EXCISIONAL BIOPSY      Vitals:   01/14/18 1034  BP: 121/84    Subjective Assessment - 01/15/18 2056    Subjective  Pt states she just got back from [place] on Sunday - felt some dizziness of short duration yesterday am and again this morning; had headache Monday night and took Tylenol for it; developed pink eye right before she left for trip to [place]     Pertinent History  vertigo    Patient Stated Goals  To get rid of dizziness completely    Currently in Pain?  No/denies        NeuroRe-ed;  Pt performed Rt & Lt sidelying tests - pt had no nystagmus and no c/o vertigo with either sidelying position  (-) Rt and Lt Dix-Hallpike tests with no nystagmus and no c/o vertigo  Pt performed tandem stance 30 sec hold each position - with UE support prn SLS on each leg 10 sec hold with UE support - added these 2 exercises to HEP  Pt performed standing on foam - feet apart and feet together - with EO and EC; head turns with targets for horizontal and vertical head turns with EO Head turns with EC for increased vestibular input in  maintaining balance                           PT Short Term Goals - 12/09/17 2041      PT SHORT TERM GOAL #1   Title  = LTG        PT Long Term Goals - 01/15/18 2058      PT LONG TERM GOAL #1   Title  Pt will be independent with vestibular and balance HEP    Status  Achieved      PT LONG TERM GOAL #2   Title  Pt will demonstrate negative positional testing and report 0/10 dizziness with supine <> sit    Status  Achieved      PT LONG TERM GOAL #3   Title  Pt will improve DVA to 3 line difference to indicate improved use of VOR    Status  Deferred      PT LONG TERM GOAL #4   Title  Pt will improve gait velocity to >/= 2.62 to indicate decreased falls risk in community    Status  Achieved      PT LONG TERM GOAL #5  Title  Pt will improve overall function as indicated by FOTO by 10 points    Status  Achieved              Patient will benefit from skilled therapeutic intervention in order to improve the following deficits and impairments:     Visit Diagnosis: Unsteadiness on feet  Dizziness and giddiness     Problem List There are no active problems to display for this patient.     PHYSICAL THERAPY DISCHARGE SUMMARY  Visits from Start of Care: 6  Current functional level related to goals / functional outcomes: Pt has met all LTG's - pt reports no vertigo at this time   Remaining deficits: Decreased high level balance skills, specifically SLS and tandem stance   Education / Equipment: Pt has been instructed in HEP consisting of balance exercises and standing on compliant surface for increased vestibular input in maintaining balance. Plan: Patient agrees to discharge.  Patient goals were met. Patient is being discharged due to meeting the stated rehab goals.  ?????       Alda Lea, PT 01/15/2018, 8:59 PM  [facility] [address] [place],  [place], [address] Phone: [phone]   Fax:  [phone]  Name: NALEIGHA RAIMONDI MRN: [id number] Date of Birth: [birth year]

## 2018-01-16 DIAGNOSIS — Z1389 Encounter for screening for other disorder: Secondary | ICD-10-CM | POA: Diagnosis not present

## 2018-01-16 DIAGNOSIS — E119 Type 2 diabetes mellitus without complications: Secondary | ICD-10-CM | POA: Diagnosis not present

## 2018-01-16 DIAGNOSIS — E669 Obesity, unspecified: Secondary | ICD-10-CM | POA: Diagnosis not present

## 2018-01-16 DIAGNOSIS — M899 Disorder of bone, unspecified: Secondary | ICD-10-CM | POA: Diagnosis not present

## 2018-01-16 DIAGNOSIS — E7800 Pure hypercholesterolemia, unspecified: Secondary | ICD-10-CM | POA: Diagnosis not present

## 2018-01-16 DIAGNOSIS — I10 Essential (primary) hypertension: Secondary | ICD-10-CM | POA: Diagnosis not present

## 2018-01-16 DIAGNOSIS — D692 Other nonthrombocytopenic purpura: Secondary | ICD-10-CM | POA: Diagnosis not present

## 2018-01-16 DIAGNOSIS — Z6831 Body mass index (BMI) 31.0-31.9, adult: Secondary | ICD-10-CM | POA: Diagnosis not present

## 2018-01-16 DIAGNOSIS — E559 Vitamin D deficiency, unspecified: Secondary | ICD-10-CM | POA: Diagnosis not present

## 2018-01-16 DIAGNOSIS — Z0000 Encounter for general adult medical examination without abnormal findings: Secondary | ICD-10-CM | POA: Diagnosis not present

## 2018-01-20 ENCOUNTER — Encounter: Payer: Medicare Other | Admitting: Physical Therapy

## 2018-01-27 ENCOUNTER — Encounter: Payer: Medicare Other | Admitting: Physical Therapy

## 2018-02-03 ENCOUNTER — Encounter: Payer: Medicare Other | Admitting: Physical Therapy

## 2018-02-18 DIAGNOSIS — B0059 Other herpesviral disease of eye: Secondary | ICD-10-CM | POA: Diagnosis not present

## 2018-02-18 DIAGNOSIS — H10521 Angular blepharoconjunctivitis, right eye: Secondary | ICD-10-CM | POA: Diagnosis not present

## 2018-02-18 DIAGNOSIS — E119 Type 2 diabetes mellitus without complications: Secondary | ICD-10-CM | POA: Diagnosis not present

## 2018-02-18 DIAGNOSIS — H348312 Tributary (branch) retinal vein occlusion, right eye, stable: Secondary | ICD-10-CM | POA: Diagnosis not present

## 2018-02-18 DIAGNOSIS — D3132 Benign neoplasm of left choroid: Secondary | ICD-10-CM | POA: Diagnosis not present

## 2018-02-18 DIAGNOSIS — Z961 Presence of intraocular lens: Secondary | ICD-10-CM | POA: Diagnosis not present

## 2018-02-18 DIAGNOSIS — H25812 Combined forms of age-related cataract, left eye: Secondary | ICD-10-CM | POA: Diagnosis not present

## 2018-03-06 DIAGNOSIS — H903 Sensorineural hearing loss, bilateral: Secondary | ICD-10-CM | POA: Diagnosis not present

## 2018-03-06 DIAGNOSIS — H6123 Impacted cerumen, bilateral: Secondary | ICD-10-CM | POA: Diagnosis not present

## 2018-04-02 DIAGNOSIS — Z6832 Body mass index (BMI) 32.0-32.9, adult: Secondary | ICD-10-CM | POA: Diagnosis not present

## 2018-04-02 DIAGNOSIS — Z01419 Encounter for gynecological examination (general) (routine) without abnormal findings: Secondary | ICD-10-CM | POA: Diagnosis not present

## 2018-04-02 DIAGNOSIS — N3945 Continuous leakage: Secondary | ICD-10-CM | POA: Diagnosis not present

## 2018-04-10 ENCOUNTER — Other Ambulatory Visit: Payer: Self-pay | Admitting: Family Medicine

## 2018-04-10 DIAGNOSIS — Z1231 Encounter for screening mammogram for malignant neoplasm of breast: Secondary | ICD-10-CM

## 2018-04-16 DIAGNOSIS — M25512 Pain in left shoulder: Secondary | ICD-10-CM | POA: Diagnosis not present

## 2018-04-18 DIAGNOSIS — M25512 Pain in left shoulder: Secondary | ICD-10-CM | POA: Diagnosis not present

## 2018-04-22 DIAGNOSIS — M25512 Pain in left shoulder: Secondary | ICD-10-CM | POA: Diagnosis not present

## 2018-04-24 DIAGNOSIS — M25512 Pain in left shoulder: Secondary | ICD-10-CM | POA: Diagnosis not present

## 2018-04-29 DIAGNOSIS — M25512 Pain in left shoulder: Secondary | ICD-10-CM | POA: Diagnosis not present

## 2018-05-02 DIAGNOSIS — M25512 Pain in left shoulder: Secondary | ICD-10-CM | POA: Diagnosis not present

## 2018-05-05 DIAGNOSIS — M25512 Pain in left shoulder: Secondary | ICD-10-CM | POA: Diagnosis not present

## 2018-05-07 DIAGNOSIS — Z23 Encounter for immunization: Secondary | ICD-10-CM | POA: Diagnosis not present

## 2018-05-08 DIAGNOSIS — M25512 Pain in left shoulder: Secondary | ICD-10-CM | POA: Diagnosis not present

## 2018-05-12 DIAGNOSIS — M25512 Pain in left shoulder: Secondary | ICD-10-CM | POA: Diagnosis not present

## 2018-05-14 ENCOUNTER — Ambulatory Visit
Admission: RE | Admit: 2018-05-14 | Discharge: 2018-05-14 | Disposition: A | Discharge status: Home / Self Care | Payer: Medicare Other | Source: Ambulatory Visit | Attending: Family Medicine | Admitting: Family Medicine

## 2018-05-14 DIAGNOSIS — Z1231 Encounter for screening mammogram for malignant neoplasm of breast: Secondary | ICD-10-CM | POA: Diagnosis not present

## 2018-05-15 DIAGNOSIS — M25512 Pain in left shoulder: Secondary | ICD-10-CM | POA: Diagnosis not present

## 2018-05-19 DIAGNOSIS — M25512 Pain in left shoulder: Secondary | ICD-10-CM | POA: Diagnosis not present

## 2018-05-22 DIAGNOSIS — M25512 Pain in left shoulder: Secondary | ICD-10-CM | POA: Diagnosis not present

## 2018-05-27 DIAGNOSIS — M25512 Pain in left shoulder: Secondary | ICD-10-CM | POA: Diagnosis not present

## 2018-06-02 DIAGNOSIS — M25512 Pain in left shoulder: Secondary | ICD-10-CM | POA: Diagnosis not present

## 2018-06-05 DIAGNOSIS — M25512 Pain in left shoulder: Secondary | ICD-10-CM | POA: Diagnosis not present

## 2018-06-09 DIAGNOSIS — M25512 Pain in left shoulder: Secondary | ICD-10-CM | POA: Diagnosis not present

## 2018-06-12 DIAGNOSIS — M25512 Pain in left shoulder: Secondary | ICD-10-CM | POA: Diagnosis not present

## 2018-06-19 DIAGNOSIS — M25512 Pain in left shoulder: Secondary | ICD-10-CM | POA: Diagnosis not present

## 2018-07-07 DIAGNOSIS — M25512 Pain in left shoulder: Secondary | ICD-10-CM | POA: Diagnosis not present

## 2018-07-11 DIAGNOSIS — M25512 Pain in left shoulder: Secondary | ICD-10-CM | POA: Diagnosis not present

## 2018-07-14 DIAGNOSIS — M25512 Pain in left shoulder: Secondary | ICD-10-CM | POA: Diagnosis not present

## 2018-07-16 ENCOUNTER — Encounter (INDEPENDENT_AMBULATORY_CARE_PROVIDER_SITE_OTHER): Payer: Medicare Other | Admitting: Ophthalmology

## 2018-08-01 ENCOUNTER — Encounter (INDEPENDENT_AMBULATORY_CARE_PROVIDER_SITE_OTHER): Payer: Medicare Other | Admitting: Ophthalmology

## 2018-08-11 DIAGNOSIS — M25512 Pain in left shoulder: Secondary | ICD-10-CM | POA: Diagnosis not present

## 2018-08-11 DIAGNOSIS — E559 Vitamin D deficiency, unspecified: Secondary | ICD-10-CM | POA: Diagnosis not present

## 2018-08-11 DIAGNOSIS — I10 Essential (primary) hypertension: Secondary | ICD-10-CM | POA: Diagnosis not present

## 2018-08-11 DIAGNOSIS — R42 Dizziness and giddiness: Secondary | ICD-10-CM | POA: Diagnosis not present

## 2018-08-11 DIAGNOSIS — E1169 Type 2 diabetes mellitus with other specified complication: Secondary | ICD-10-CM | POA: Diagnosis not present

## 2018-08-11 DIAGNOSIS — D692 Other nonthrombocytopenic purpura: Secondary | ICD-10-CM | POA: Diagnosis not present

## 2018-08-11 DIAGNOSIS — M899 Disorder of bone, unspecified: Secondary | ICD-10-CM | POA: Diagnosis not present

## 2018-08-11 DIAGNOSIS — E7800 Pure hypercholesterolemia, unspecified: Secondary | ICD-10-CM | POA: Diagnosis not present

## 2018-08-12 ENCOUNTER — Encounter (INDEPENDENT_AMBULATORY_CARE_PROVIDER_SITE_OTHER): Payer: Medicare Other | Admitting: Ophthalmology

## 2018-08-12 DIAGNOSIS — H348312 Tributary (branch) retinal vein occlusion, right eye, stable: Secondary | ICD-10-CM

## 2018-08-12 DIAGNOSIS — H43813 Vitreous degeneration, bilateral: Secondary | ICD-10-CM

## 2018-08-12 DIAGNOSIS — D3132 Benign neoplasm of left choroid: Secondary | ICD-10-CM | POA: Diagnosis not present

## 2018-08-12 DIAGNOSIS — H2512 Age-related nuclear cataract, left eye: Secondary | ICD-10-CM

## 2018-08-12 DIAGNOSIS — I10 Essential (primary) hypertension: Secondary | ICD-10-CM

## 2018-08-12 DIAGNOSIS — H35033 Hypertensive retinopathy, bilateral: Secondary | ICD-10-CM | POA: Diagnosis not present

## 2018-08-20 DIAGNOSIS — M25512 Pain in left shoulder: Secondary | ICD-10-CM | POA: Diagnosis not present

## 2018-09-04 DIAGNOSIS — M25512 Pain in left shoulder: Secondary | ICD-10-CM | POA: Diagnosis not present

## 2018-09-10 DIAGNOSIS — Z85828 Personal history of other malignant neoplasm of skin: Secondary | ICD-10-CM | POA: Diagnosis not present

## 2018-09-10 DIAGNOSIS — L821 Other seborrheic keratosis: Secondary | ICD-10-CM | POA: Diagnosis not present

## 2018-09-10 DIAGNOSIS — L570 Actinic keratosis: Secondary | ICD-10-CM | POA: Diagnosis not present

## 2018-09-10 DIAGNOSIS — Z23 Encounter for immunization: Secondary | ICD-10-CM | POA: Diagnosis not present

## 2019-02-25 DIAGNOSIS — Z961 Presence of intraocular lens: Secondary | ICD-10-CM | POA: Diagnosis not present

## 2019-02-25 DIAGNOSIS — D3132 Benign neoplasm of left choroid: Secondary | ICD-10-CM | POA: Diagnosis not present

## 2019-02-25 DIAGNOSIS — E119 Type 2 diabetes mellitus without complications: Secondary | ICD-10-CM | POA: Diagnosis not present

## 2019-02-25 DIAGNOSIS — H25812 Combined forms of age-related cataract, left eye: Secondary | ICD-10-CM | POA: Diagnosis not present

## 2019-02-25 DIAGNOSIS — H348312 Tributary (branch) retinal vein occlusion, right eye, stable: Secondary | ICD-10-CM | POA: Diagnosis not present

## 2019-04-04 DIAGNOSIS — Z23 Encounter for immunization: Secondary | ICD-10-CM | POA: Diagnosis not present

## 2019-04-07 DIAGNOSIS — E7800 Pure hypercholesterolemia, unspecified: Secondary | ICD-10-CM | POA: Diagnosis not present

## 2019-04-07 DIAGNOSIS — M899 Disorder of bone, unspecified: Secondary | ICD-10-CM | POA: Diagnosis not present

## 2019-04-07 DIAGNOSIS — K219 Gastro-esophageal reflux disease without esophagitis: Secondary | ICD-10-CM | POA: Diagnosis not present

## 2019-04-07 DIAGNOSIS — E559 Vitamin D deficiency, unspecified: Secondary | ICD-10-CM | POA: Diagnosis not present

## 2019-04-07 DIAGNOSIS — E1169 Type 2 diabetes mellitus with other specified complication: Secondary | ICD-10-CM | POA: Diagnosis not present

## 2019-04-07 DIAGNOSIS — Z0000 Encounter for general adult medical examination without abnormal findings: Secondary | ICD-10-CM | POA: Diagnosis not present

## 2019-04-07 DIAGNOSIS — I10 Essential (primary) hypertension: Secondary | ICD-10-CM | POA: Diagnosis not present

## 2019-04-14 DIAGNOSIS — E7800 Pure hypercholesterolemia, unspecified: Secondary | ICD-10-CM | POA: Diagnosis not present

## 2019-04-14 DIAGNOSIS — E1169 Type 2 diabetes mellitus with other specified complication: Secondary | ICD-10-CM | POA: Diagnosis not present

## 2019-04-14 DIAGNOSIS — E559 Vitamin D deficiency, unspecified: Secondary | ICD-10-CM | POA: Diagnosis not present

## 2019-04-14 DIAGNOSIS — I10 Essential (primary) hypertension: Secondary | ICD-10-CM | POA: Diagnosis not present

## 2019-06-17 ENCOUNTER — Other Ambulatory Visit: Payer: Self-pay | Admitting: Family Medicine

## 2019-06-17 DIAGNOSIS — Z1231 Encounter for screening mammogram for malignant neoplasm of breast: Secondary | ICD-10-CM

## 2019-07-01 IMAGING — DX DG CHEST 2V
2 series · 2 of 2 positions shown · non-contrast
Comparison: None.

CLINICAL DATA: Patient not feeling well.  Chest discomfort.

EXAM:
CHEST - 2 VIEW

[w chest pa]
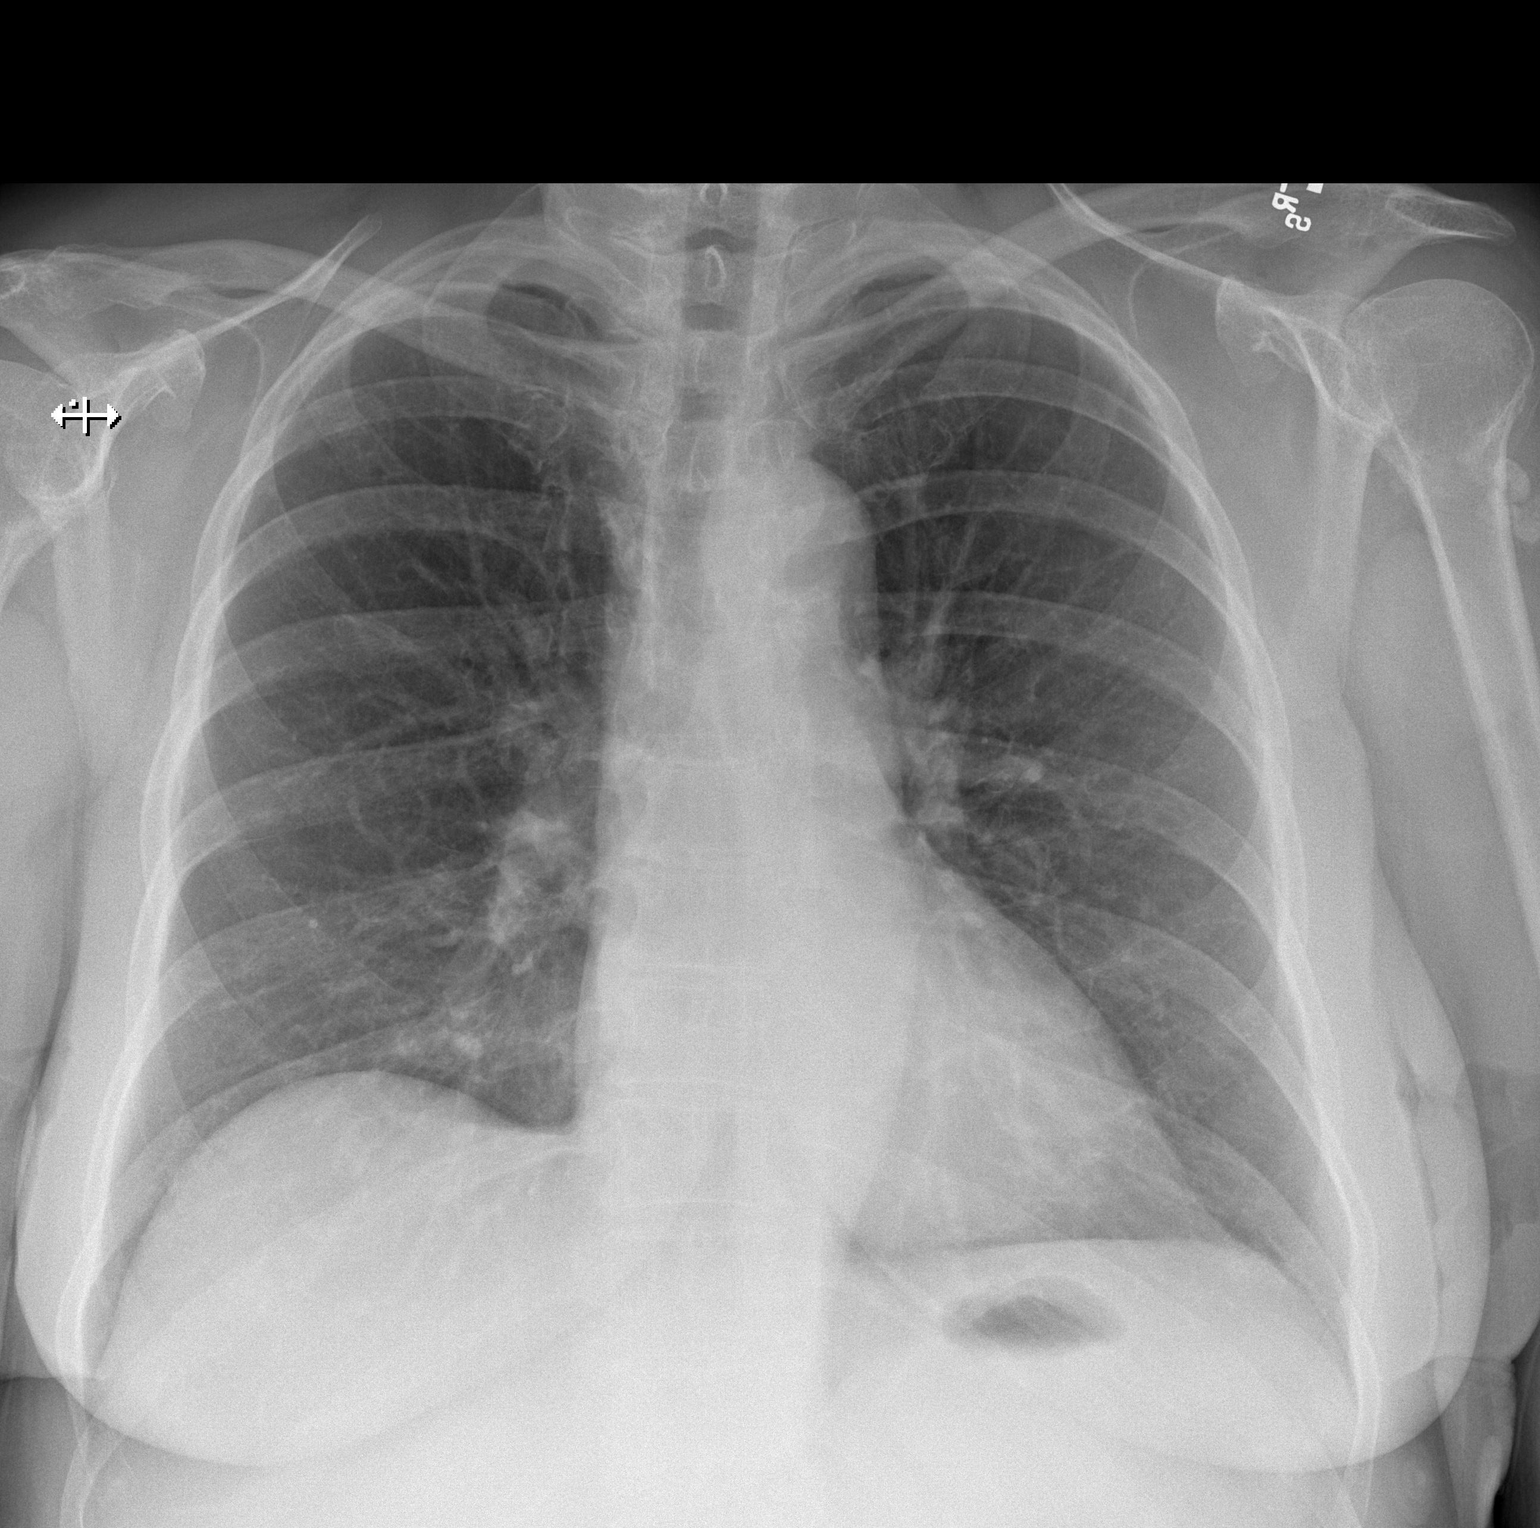

[w chest lat]
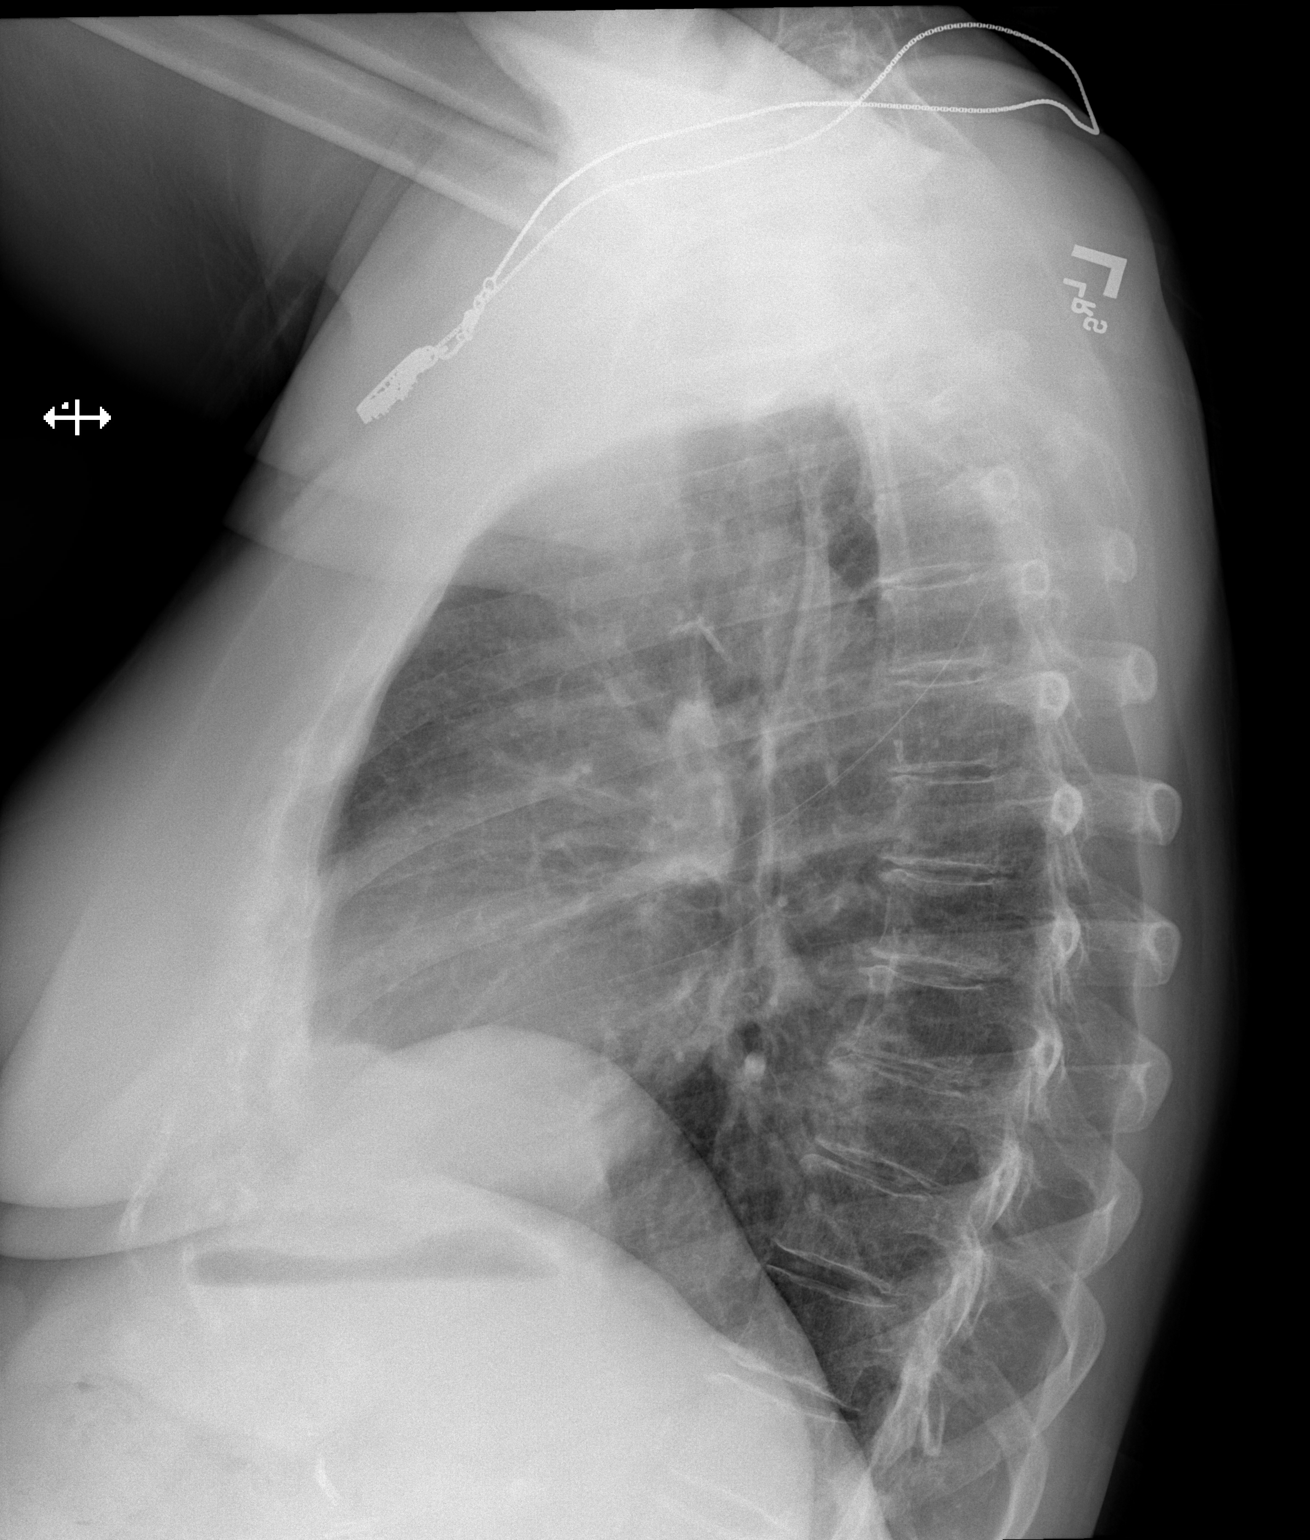

[2 of 2 positions shown; findings below may reference images not displayed]

FINDINGS: The heart size and mediastinal contours are within normal limits.
Both lungs are clear. The visualized skeletal structures are
unremarkable.
IMPRESSION: No active cardiopulmonary disease.

## 2019-07-03 DIAGNOSIS — C50919 Malignant neoplasm of unspecified site of unspecified female breast: Secondary | ICD-10-CM

## 2019-07-03 HISTORY — DX: Malignant neoplasm of unspecified site of unspecified female breast: C50.919

## 2019-07-21 ENCOUNTER — Other Ambulatory Visit: Payer: Self-pay | Admitting: Obstetrics and Gynecology

## 2019-07-21 DIAGNOSIS — Z6830 Body mass index (BMI) 30.0-30.9, adult: Secondary | ICD-10-CM | POA: Diagnosis not present

## 2019-07-21 DIAGNOSIS — Z124 Encounter for screening for malignant neoplasm of cervix: Secondary | ICD-10-CM | POA: Diagnosis not present

## 2019-07-21 DIAGNOSIS — N6459 Other signs and symptoms in breast: Secondary | ICD-10-CM | POA: Diagnosis not present

## 2019-07-21 DIAGNOSIS — R32 Unspecified urinary incontinence: Secondary | ICD-10-CM | POA: Diagnosis not present

## 2019-07-24 ENCOUNTER — Ambulatory Visit: Payer: Medicare Other | Attending: Internal Medicine

## 2019-07-24 DIAGNOSIS — Z23 Encounter for immunization: Secondary | ICD-10-CM | POA: Insufficient documentation

## 2019-07-24 NOTE — Progress Notes (Signed)
   Covid-19 Vaccination Clinic  Name:  Jacqueline Stephenson    MRN: [id number] DOB: [birth year]  07/24/2019  [name] was observed post Covid-19 immunization for 15 minutes without incidence. She was provided with Vaccine Information Sheet and instruction to access the V-Safe system.   [name] was instructed to call 911 with any severe reactions post vaccine: Marland Kitchen Difficulty breathing  . Swelling of your face and throat  . A fast heartbeat  . A bad rash all over your body  . Dizziness and weakness    Immunizations Administered    Name Date Dose VIS Date Route   Pfizer COVID-19 Vaccine 07/24/2019  1:03 PM 0.3 mL 06/12/2019 Intramuscular   Manufacturer: [place]   Lot: [id number]   [place]: [id number]

## 2019-07-30 ENCOUNTER — Other Ambulatory Visit: Payer: Self-pay | Admitting: Obstetrics and Gynecology

## 2019-07-30 ENCOUNTER — Ambulatory Visit
Admission: RE | Admit: 2019-07-30 | Discharge: 2019-07-30 | Disposition: A | Discharge status: Home / Self Care | Payer: Medicare Other | Source: Ambulatory Visit | Attending: Obstetrics and Gynecology | Admitting: Obstetrics and Gynecology

## 2019-07-30 ENCOUNTER — Other Ambulatory Visit: Payer: Self-pay

## 2019-07-30 DIAGNOSIS — N6002 Solitary cyst of left breast: Secondary | ICD-10-CM | POA: Diagnosis not present

## 2019-07-30 DIAGNOSIS — N6459 Other signs and symptoms in breast: Secondary | ICD-10-CM

## 2019-07-30 DIAGNOSIS — R928 Other abnormal and inconclusive findings on diagnostic imaging of breast: Secondary | ICD-10-CM

## 2019-07-30 DIAGNOSIS — N6320 Unspecified lump in the left breast, unspecified quadrant: Secondary | ICD-10-CM

## 2019-08-06 ENCOUNTER — Ambulatory Visit
Admission: RE | Admit: 2019-08-06 | Discharge: 2019-08-06 | Disposition: A | Discharge status: Home / Self Care | Payer: Medicare Other | Source: Ambulatory Visit | Attending: Obstetrics and Gynecology | Admitting: Obstetrics and Gynecology

## 2019-08-06 ENCOUNTER — Ambulatory Visit: Payer: Medicare Other

## 2019-08-06 ENCOUNTER — Other Ambulatory Visit: Payer: Self-pay

## 2019-08-06 DIAGNOSIS — R928 Other abnormal and inconclusive findings on diagnostic imaging of breast: Secondary | ICD-10-CM

## 2019-08-06 DIAGNOSIS — C50312 Malignant neoplasm of lower-inner quadrant of left female breast: Secondary | ICD-10-CM | POA: Diagnosis not present

## 2019-08-06 DIAGNOSIS — Z170 Estrogen receptor positive status [ER+]: Secondary | ICD-10-CM | POA: Diagnosis not present

## 2019-08-06 DIAGNOSIS — N6324 Unspecified lump in the left breast, lower inner quadrant: Secondary | ICD-10-CM | POA: Diagnosis not present

## 2019-08-10 ENCOUNTER — Ambulatory Visit: Payer: Self-pay | Admitting: Surgery

## 2019-08-10 DIAGNOSIS — C50012 Malignant neoplasm of nipple and areola, left female breast: Secondary | ICD-10-CM

## 2019-08-10 DIAGNOSIS — Z170 Estrogen receptor positive status [ER+]: Secondary | ICD-10-CM | POA: Diagnosis not present

## 2019-08-10 DIAGNOSIS — C50912 Malignant neoplasm of unspecified site of left female breast: Secondary | ICD-10-CM | POA: Diagnosis not present

## 2019-08-10 NOTE — H&P (View-Only) (Signed)
Jacqueline Stephenson Documented: 08/10/2019 2:10 PM Location: [facility] Patient #: [id number] DOB: 04/20/1941 Married / Language: English / Race: White Female  History of Present Illness Marcello Moores A. Bergen Magner MD; 08/10/2019 3:46 PM) Patient words: Patient presents for evaluation of left breast cancer. She underwent a screening mammogram which detected on area just below her left nipple measuring 1 cm. Core biopsy was done which showed invasive lobular carcinoma ER positive, PR positive HER-2/neu negative. She denies history of breast pain, nipple discharge, change in the appearance of the nipple or pain.      79 year old patient presents for annual mammogram and evaluation of left nipple inversion. She does not palpate a mass in either breast.  EXAM: DIGITAL DIAGNOSTIC BILATERAL MAMMOGRAM WITH CAD AND TOMO  ULTRASOUND LEFT BREAST  COMPARISON: Previous exam(s).  ACR Breast Density Category b: There are scattered areas of fibroglandular density.  FINDINGS: The left nipple appears inverted on the mammogram. Mammographically, left nipple inversion appears to be a chronic finding, but nipple inversion is best assessed on clinical exam. A new asymmetry is identified in the inferior slightly medial left breast on today's mammogram and evaluated with spot compression views. These views confirm an irregular mass with indistinct margins measuring approximately 0.9 cm. Additionally, in the slightly outer retroareolar left breast is a circumscribed nodule measuring 4 mm.  No suspicious findings identified in the right breast.  Mammographic images were processed with CAD.  On physical exam, there is central left nipple inversion. No mass is palpated in the left breast. No skin retraction is seen.  Targeted ultrasound is performed, showing an irregular heterogeneously hypoechoic mass at 6 o'clock position left breast 3 cm from the nipple measuring 0.9 x 0.6 x 0.4 cm, with  internal vascular flow.  Benign cyst is noted in the outer retroareolar left breast 3 o'clock position 1 cm from the nipple measuring 3 x 2 x 2 mm, accounting for a small circumscribed mass seen on the mammogram.  Ultrasound of the left axilla is negative for lymphadenopathy.  IMPRESSION: Suspicious 0.9 cm mass 6-7 o'clock position left breast.  No evidence of malignancy in the right breast.  RECOMMENDATION: Ultrasound-guided core needle biopsy of the left breast mass is recommended and has been scheduled for the patient.  I have discussed the findings and recommendations with the patient. If applicable, a reminder letter will be sent to the patient regarding the next appointment.  BI-RADS CATEGORY 4: Suspicious.   Electronically Signed By: Curlene Dolphin M.D. On: 07/30/2019 16:14           ADDITIONAL INFORMATION: PROGNOSTIC INDICATORS Results: IMMUNOHISTOCHEMICAL AND MORPHOMETRIC ANALYSIS PERFORMED MANUALLY The tumor cells are EQUIVOCAL for Her2 (2+). HER2 by FISH will be PERFORMED and the RESULTS REPORTED SEPARATELY Estrogen Receptor: 95%, POSITIVE, STRONG STAINING INTENSITY Progesterone Receptor: 30%, POSITIVE, STRONG STAINING INTENSITY Proliferation Marker Ki67: 5% REFERENCE RANGE ESTROGEN RECEPTOR NEGATIVE 0% POSITIVE =>1% REFERENCE RANGE PROGESTERONE RECEPTOR NEGATIVE 0% POSITIVE =>1% All controls stained appropriately Vicente Males MD Pathologist, Electronic Signature ( Signed 08/10/2019) E-cadherin is NEGATIVE supporting lobular origin. Thressa Sheller MD Pathologist, Electronic Signature ( Signed 08/10/2019) 1 of 3 FINAL for Jacqueline Stephenson, Jacqueline Stephenson [phone]) FINAL DIAGNOSIS Diagnosis Breast, left, needle core biopsy, 6:30 o'clock - INVASIVE MAMMARY CARCINOMA - SEE COMMENT Microscopic Comment The biopsy material shows an infiltrative proliferation of cells with large vesicular nuclei with inconspicuous nucleoli, arranged linearly and in small  clusters. Based on the biopsy, the carcinoma appears Nottingham grade 2 of 3 and measures 0.7 cm  in greatest linear extent. E-cadherin and prognostic markers (ER/PR/ki-67/HER2)are pending and will be reported in an addendum. Dr. Jeannie Done reviewed the case and agrees with the above diagnosis. These results were called to [facility] on August 07, 2019. Thressa Sheller MD Pathologist, Electronic Signature (Case signed 08/07/2019).  The patient is a 79 year old female.   Diagnostic Studies History Andreas Blower, CMA; 08/10/2019 2:10 PM) Colonoscopy 5-10 years ago Mammogram within last year Pap Smear 1-5 years ago  Allergies Andreas Blower, CMA; 08/10/2019 2:11 PM) oxyCODONE HCl *ANALGESICS - OPIOID* Nausea.  Medication History (Armen Ferguson, CMA; 08/10/2019 2:13 PM) Lisinopril (20MG Tablet, Oral) Active. Rosuvastatin Calcium (10MG Tablet, Oral) Active. Multivitamins/Minerals (Oral) Active. Vitamin E (400UNIT Tablet, Oral) Active. Aspirin (81MG Tablet, Oral) Active. Calcium Carbonate (600MG Tablet, Oral) Active. Vitamin D (1000UNIT Tablet, Oral) Active. Medications Reconciled  Social History Andreas Blower, CMA; 08/10/2019 2:10 PM) Caffeine use Carbonated beverages, Tea. No drug use Tobacco use Never smoker.  Family History Andreas Blower, [place]; 08/10/2019 2:10 PM) Heart Disease Father, Mother.  Pregnancy / Birth History Andreas Blower, [place]; 08/10/2019 2:10 PM) Age at menarche 27 years. Gravida 1 Length (months) of breastfeeding 3-6 Maternal age 33-30 Para 1  Other Problems (Andreas Blower, [place]; 08/10/2019 2:10 PM) Arthritis Breast Cancer Diabetes Mellitus Gastroesophageal Reflux Disease Heart murmur High blood pressure Hypercholesterolemia     Review of Systems (Armen Ferguson CMA; 08/10/2019 2:10 PM) General Not Present- Appetite Loss, Chills, Fatigue, Fever, Night Sweats, Weight Gain and Weight Loss. Skin Not Present- Change  in Wart/Mole, Dryness, Hives, Jaundice, New Lesions, Non-Healing Wounds, Rash and Ulcer. HEENT Present- Hearing Loss and Wears glasses/contact lenses. Not Present- Earache, Hoarseness, Nose Bleed, Oral Ulcers, Ringing in the Ears, Seasonal Allergies, Sinus Pain, Sore Throat, Visual Disturbances and Yellow Eyes. Cardiovascular Not Present- Chest Pain, Difficulty Breathing Lying Down, Leg Cramps, Palpitations, Rapid Heart Rate, Shortness of Breath and Swelling of Extremities. Gastrointestinal Not Present- Abdominal Pain, Bloating, Bloody Stool, Change in Bowel Habits, Chronic diarrhea, Constipation, Difficulty Swallowing, Excessive gas, Gets full quickly at meals, Hemorrhoids, Indigestion, Nausea, Rectal Pain and Vomiting. Musculoskeletal Not Present- Back Pain, Joint Pain, Joint Stiffness, Muscle Pain, Muscle Weakness and Swelling of Extremities. Neurological Not Present- Decreased Memory, Fainting, Headaches, Numbness, Seizures, Tingling, Tremor, Trouble walking and Weakness. Psychiatric Not Present- Anxiety, Bipolar, Change in Sleep Pattern, Depression, Fearful and Frequent crying. Endocrine Not Present- Cold Intolerance, Excessive Hunger, Hair Changes, Heat Intolerance, Hot flashes and New Diabetes. Hematology Not Present- Blood Thinners, Easy Bruising, Excessive bleeding, Gland problems, HIV and Persistent Infections.  Vitals (Armen Ferguson CMA; 08/10/2019 2:11 PM) 08/10/2019 2:11 PM Weight: 173.38 lb Height: 61in Body Surface Area: 1.78 m Body Mass Index: 32.76 kg/m  Temp.: 97.49F  Pulse: 97 (Regular)  P.OX: 97% (Room air) BP: 154/100 (Sitting, Left Arm, Standard)        Physical Exam (Daniya Aramburo A. Dimitri Dsouza MD; 08/10/2019 3:48 PM)  General Mental Status-Alert. General Appearance-Consistent with stated age. Hydration-Well hydrated. Voice-Normal.  Head and Neck Head-normocephalic, atraumatic with no lesions or palpable masses. Trachea-midline. Thyroid Gland  Characteristics - normal size and consistency.  Chest and Lung Exam Note: WOB NORMAL NO WHEEZING  Breast Note: Bruising left breast noted. Mild nipple inversion left sided noted without discharge. No masses. Right breast is normal.  Cardiovascular Note: NSR NO HEAVE  Neurologic Neurologic evaluation reveals -alert and oriented x 3 with no impairment of recent or remote memory. Mental Status-Normal.  Lymphatic Head & Neck  General Head & Neck Lymphatics: Bilateral -  Description - Normal. Axillary  General Axillary Region: Bilateral - Description - Normal. Tenderness - Non Tender.    Assessment & Plan (Nehemiah Montee A. Cleve Paolillo MD; 08/10/2019 3:50 PM)  STAGE 1 BREAST CANCER, ER+, LEFT (C50.912) Impression: lobular subtype needs MRI ONCOLOGY RADIATION ONCOLOGY Discussed breast conservation versus mastectomy with reconstruction. Pros, cons and the treatment is necessary for each discussed. She will proceed with left breast lumpectomy with sentinel lymph node mapping. Risk of lumpectomy include bleeding, infection, seroma, more surgery, use of seed/wire, wound care, cosmetic deformity and the need for other treatments, death , blood clots, death. Pt agrees to proceed. Risk of sentinel lymph node mapping include bleeding, infection, lymphedema, shoulder pain. stiffness, dye allergy. cosmetic deformity , blood clots, death, need for more surgery. Pt agrees to proceed.   total time 45 minutes reviewing treatments, face to face, exam ,reviewing pathology report reviewing path and counseling  Current Plans You are being scheduled for surgery- Our schedulers will call you.  You should hear from our office's scheduling department within 5 working days about the location, date, and time of surgery. We try to make accommodations for patient's preferences in scheduling surgery, but sometimes the OR schedule or the surgeon's schedule prevents [place] from making those accommodations.  If you  have not heard from our office [phone]) in 5 working days, call the office and ask for your surgeon's nurse.  If you have other questions about your diagnosis, plan, or surgery, call the office and ask for your surgeon's nurse.  Pt Education - [facility] Breast Cancer Information Given - [facility] "Breast Journey" Package Pt Education - Pamphlet Given - Breast Biopsy: discussed with patient and provided information. We discussed the staging and pathophysiology of breast cancer. We discussed all of the different options for treatment for breast cancer including surgery, chemotherapy, radiation therapy, Herceptin, and antiestrogen therapy. We discussed a sentinel lymph node biopsy as she does not appear to having lymph node involvement right now. We discussed the performance of that with injection of radioactive tracer and blue dye. We discussed that she would have an incision underneath her axillary hairline. We discussed that there is a bout a 10-20% chance of having a positive node with a sentinel lymph node biopsy and we will await the permanent pathology to make any other first further decisions in terms of her treatment. One of these options might be to return to the operating room to perform an axillary lymph node dissection. We discussed about a 1-2% risk lifetime of chronic shoulder pain as well as lymphedema associated with a sentinel lymph node biopsy. We discussed the options for treatment of the breast cancer which included lumpectomy versus a mastectomy. We discussed the performance of the lumpectomy with a wire placement. We discussed a 10-20% chance of a positive margin requiring reexcision in the operating room. We also discussed that she may need radiation therapy or antiestrogen therapy or both if she undergoes lumpectomy. We discussed the mastectomy and the postoperative care for that as well. We discussed that there is no difference in her survival whether she undergoes lumpectomy with  radiation therapy or antiestrogen therapy versus a mastectomy. There is a slight difference in the local recurrence rate being 3-5% with lumpectomy and about 1% with a mastectomy. We discussed the risks of operation including bleeding, infection, possible reoperation. She understands her further therapy will be based on what her stages at the time of her operation.  Pt Education - [facility] breast cancer surgery: discussed with patient  and provided information.

## 2019-08-10 NOTE — H&P (Signed)
Jacqueline Stephenson Documented: 08/10/2019 2:10 PM Location: [facility] Patient #: [id number] DOB: 04/20/1941 Married / Language: English / Race: White Female  History of Present Illness Jacqueline Moores A. Norina Cowper MD; 08/10/2019 3:46 PM) Patient words: Patient presents for evaluation of left breast cancer. She underwent a screening mammogram which detected on area just below her left nipple measuring 1 cm. Core biopsy was Stephenson which showed invasive lobular carcinoma ER positive, PR positive HER-2/neu negative. She denies history of breast pain, nipple discharge, change in the appearance of the nipple or pain.      79 year old patient presents for annual mammogram and evaluation of left nipple inversion. She does not palpate a mass in either breast.  EXAM: DIGITAL DIAGNOSTIC BILATERAL MAMMOGRAM WITH CAD AND TOMO  ULTRASOUND LEFT BREAST  COMPARISON: Previous exam(s).  ACR Breast Density Category b: There are scattered areas of fibroglandular density.  FINDINGS: The left nipple appears inverted on the mammogram. Mammographically, left nipple inversion appears to be a chronic finding, but nipple inversion is best assessed on clinical exam. A new asymmetry is identified in the inferior slightly medial left breast on today's mammogram and evaluated with spot compression views. These views confirm an irregular mass with indistinct margins measuring approximately 0.9 cm. Additionally, in the slightly outer retroareolar left breast is a circumscribed nodule measuring 4 mm.  No suspicious findings identified in the right breast.  Mammographic images were processed with CAD.  On physical exam, there is central left nipple inversion. No mass is palpated in the left breast. No skin retraction is seen.  Targeted ultrasound is performed, showing an irregular heterogeneously hypoechoic mass at 6 o'clock position left breast 3 cm from the nipple measuring 0.9 x 0.6 x 0.4 cm, with  internal vascular flow.  Benign cyst is noted in the outer retroareolar left breast 3 o'clock position 1 cm from the nipple measuring 3 x 2 x 2 mm, accounting for a small circumscribed mass seen on the mammogram.  Ultrasound of the left axilla is negative for lymphadenopathy.  IMPRESSION: Suspicious 0.9 cm mass 6-7 o'clock position left breast.  No evidence of malignancy in the right breast.  RECOMMENDATION: Ultrasound-guided core needle biopsy of the left breast mass is recommended and has been scheduled for the patient.  I have discussed the findings and recommendations with the patient. If applicable, a reminder letter will be sent to the patient regarding the next appointment.  BI-RADS CATEGORY 4: Suspicious.   Electronically Signed By: Curlene Dolphin M.D. On: 07/30/2019 16:14           ADDITIONAL INFORMATION: PROGNOSTIC INDICATORS Results: IMMUNOHISTOCHEMICAL AND MORPHOMETRIC ANALYSIS PERFORMED MANUALLY The tumor cells are EQUIVOCAL for Her2 (2+). HER2 by FISH will be PERFORMED and the RESULTS REPORTED SEPARATELY Estrogen Receptor: 95%, POSITIVE, STRONG STAINING INTENSITY Progesterone Receptor: 30%, POSITIVE, STRONG STAINING INTENSITY Proliferation Marker Ki67: 5% REFERENCE RANGE ESTROGEN RECEPTOR NEGATIVE 0% POSITIVE =>1% REFERENCE RANGE PROGESTERONE RECEPTOR NEGATIVE 0% POSITIVE =>1% All controls stained appropriately Jacqueline Males MD Pathologist, Electronic Signature ( Signed 08/10/2019) E-cadherin is NEGATIVE supporting lobular origin. Jacqueline Sheller MD Pathologist, Electronic Signature ( Signed 08/10/2019) 1 of 3 FINAL for Jacqueline Stephenson, Jacqueline Stephenson [phone]) FINAL DIAGNOSIS Diagnosis Breast, left, needle core biopsy, 6:30 o'clock - INVASIVE MAMMARY CARCINOMA - SEE COMMENT Microscopic Comment The biopsy material shows an infiltrative proliferation of cells with large vesicular nuclei with inconspicuous nucleoli, arranged linearly and in small  clusters. Based on the biopsy, the carcinoma appears Nottingham grade 2 of 3 and measures 0.7 cm  in greatest linear extent. E-cadherin and prognostic markers (ER/PR/ki-67/HER2)are pending and will be reported in an addendum. Jacqueline Stephenson reviewed the case and agrees with the above diagnosis. These results were called to [facility] on August 07, 2019. Jacqueline Sheller MD Pathologist, Electronic Signature (Case signed 08/07/2019).  The patient is a 79 year old female.   Diagnostic Studies History Jacqueline Stephenson, CMA; 08/10/2019 2:10 PM) Colonoscopy 5-10 years ago Mammogram within last year Pap Smear 1-5 years ago  Allergies Jacqueline Stephenson, CMA; 08/10/2019 2:11 PM) oxyCODONE HCl *ANALGESICS - OPIOID* Nausea.  Medication History (Jacqueline Stephenson, CMA; 08/10/2019 2:13 PM) Lisinopril (20MG Tablet, Oral) Active. Rosuvastatin Calcium (10MG Tablet, Oral) Active. Multivitamins/Minerals (Oral) Active. Vitamin E (400UNIT Tablet, Oral) Active. Aspirin (81MG Tablet, Oral) Active. Calcium Carbonate (600MG Tablet, Oral) Active. Vitamin D (1000UNIT Tablet, Oral) Active. Medications Reconciled  Social History Jacqueline Stephenson, CMA; 08/10/2019 2:10 PM) Caffeine use Carbonated beverages, Tea. No drug use Tobacco use Never smoker.  Family History Jacqueline Stephenson, Jacqueline Stephenson; 08/10/2019 2:10 PM) Heart Disease Father, Mother.  Pregnancy / Birth History Jacqueline Stephenson, Jacqueline Stephenson; 08/10/2019 2:10 PM) Age at menarche 27 years. Gravida 1 Length (months) of breastfeeding 3-6 Maternal age 33-30 Para 1  Other Problems (Jacqueline Stephenson, [place]; 08/10/2019 2:10 PM) Arthritis Breast Cancer Diabetes Mellitus Gastroesophageal Reflux Disease Heart murmur High blood pressure Hypercholesterolemia     Review of Systems (Jacqueline Stephenson CMA; 08/10/2019 2:10 PM) General Not Present- Appetite Loss, Chills, Fatigue, Fever, Night Sweats, Weight Gain and Weight Loss. Skin Not Present- Change  in Wart/Mole, Dryness, Hives, Jaundice, New Lesions, Non-Healing Wounds, Rash and Ulcer. HEENT Present- Hearing Loss and Wears glasses/contact lenses. Not Present- Earache, Hoarseness, Nose Bleed, Oral Ulcers, Ringing in the Ears, Seasonal Allergies, Sinus Pain, Sore Throat, Visual Disturbances and Yellow Eyes. Cardiovascular Not Present- Chest Pain, Difficulty Breathing Lying Down, Leg Cramps, Palpitations, Rapid Heart Rate, Shortness of Breath and Swelling of Extremities. Gastrointestinal Not Present- Abdominal Pain, Bloating, Bloody Stool, Change in Bowel Habits, Chronic diarrhea, Constipation, Difficulty Swallowing, Excessive gas, Gets full quickly at meals, Hemorrhoids, Indigestion, Nausea, Rectal Pain and Vomiting. Musculoskeletal Not Present- Back Pain, Joint Pain, Joint Stiffness, Muscle Pain, Muscle Weakness and Swelling of Extremities. Neurological Not Present- Decreased Memory, Fainting, Headaches, Numbness, Seizures, Tingling, Tremor, Trouble walking and Weakness. Psychiatric Not Present- Anxiety, Bipolar, Change in Sleep Pattern, Depression, Fearful and Frequent crying. Endocrine Not Present- Cold Intolerance, Excessive Hunger, Hair Changes, Heat Intolerance, Hot flashes and New Diabetes. Hematology Not Present- Blood Thinners, Easy Bruising, Excessive bleeding, Gland problems, HIV and Persistent Infections.  Vitals (Jacqueline Stephenson CMA; 08/10/2019 2:11 PM) 08/10/2019 2:11 PM Weight: 173.38 lb Height: 61in Body Surface Area: 1.78 m Body Mass Index: 32.76 kg/m  Temp.: 97.49F  Pulse: 97 (Regular)  P.OX: 97% (Room air) BP: 154/100 (Sitting, Left Arm, Standard)        Physical Exam (Jacqueline Koeller A. Xzavion Doswell MD; 08/10/2019 3:48 PM)  General Mental Status-Alert. General Appearance-Consistent with stated age. Hydration-Well hydrated. Voice-Normal.  Head and Neck Head-normocephalic, atraumatic with no lesions or palpable masses. Trachea-midline. Thyroid Gland  Characteristics - normal size and consistency.  Chest and Lung Exam Note: WOB NORMAL NO WHEEZING  Breast Note: Bruising left breast noted. Mild nipple inversion left sided noted without discharge. No masses. Right breast is normal.  Cardiovascular Note: NSR NO HEAVE  Neurologic Neurologic evaluation reveals -alert and oriented x 3 with no impairment of recent or remote memory. Mental Status-Normal.  Lymphatic Head & Neck  General Head & Neck Lymphatics: Bilateral -  Description - Normal. Axillary  General Axillary Region: Bilateral - Description - Normal. Tenderness - Non Tender.    Assessment & Plan (Jacqueline Beitler A. Amanda Steuart MD; 08/10/2019 3:50 PM)  STAGE 1 BREAST CANCER, ER+, LEFT (C50.912) Impression: lobular subtype needs MRI ONCOLOGY RADIATION ONCOLOGY Discussed breast conservation versus mastectomy with reconstruction. Pros, cons and the treatment is necessary for each discussed. She will proceed with left breast lumpectomy with sentinel lymph node mapping. Risk of lumpectomy include bleeding, infection, seroma, more surgery, use of seed/wire, wound care, cosmetic deformity and the need for other treatments, death , blood clots, death. Pt agrees to proceed. Risk of sentinel lymph node mapping include bleeding, infection, lymphedema, shoulder pain. stiffness, dye allergy. cosmetic deformity , blood clots, death, need for more surgery. Pt agrees to proceed.   total time 45 minutes reviewing treatments, face to face, exam ,reviewing pathology report reviewing path and counseling  Current Plans You are being scheduled for surgery- Our schedulers will call you.  You should hear from our office's scheduling department within 5 working days about the location, date, and time of surgery. We try to make accommodations for patient's preferences in scheduling surgery, but sometimes the OR schedule or the surgeon's schedule prevents [place] from making those accommodations.  If you  have not heard from our office [phone]) in 5 working days, call the office and ask for your surgeon's nurse.  If you have other questions about your diagnosis, plan, or surgery, call the office and ask for your surgeon's nurse.  Pt Education - [facility] Breast Cancer Information Given - [facility] "Breast Journey" Package Pt Education - Pamphlet Given - Breast Biopsy: discussed with patient and provided information. We discussed the staging and pathophysiology of breast cancer. We discussed all of the different options for treatment for breast cancer including surgery, chemotherapy, radiation therapy, Herceptin, and antiestrogen therapy. We discussed a sentinel lymph node biopsy as she does not appear to having lymph node involvement right now. We discussed the performance of that with injection of radioactive tracer and blue dye. We discussed that she would have an incision underneath her axillary hairline. We discussed that there is a bout a 10-20% chance of having a positive node with a sentinel lymph node biopsy and we will await the permanent pathology to make any other first further decisions in terms of her treatment. One of these options might be to return to the operating room to perform an axillary lymph node dissection. We discussed about a 1-2% risk lifetime of chronic shoulder pain as well as lymphedema associated with a sentinel lymph node biopsy. We discussed the options for treatment of the breast cancer which included lumpectomy versus a mastectomy. We discussed the performance of the lumpectomy with a wire placement. We discussed a 10-20% chance of a positive margin requiring reexcision in the operating room. We also discussed that she may need radiation therapy or antiestrogen therapy or both if she undergoes lumpectomy. We discussed the mastectomy and the postoperative care for that as well. We discussed that there is no difference in her survival whether she undergoes lumpectomy with  radiation therapy or antiestrogen therapy versus a mastectomy. There is a slight difference in the local recurrence rate being 3-5% with lumpectomy and about 1% with a mastectomy. We discussed the risks of operation including bleeding, infection, possible reoperation. She understands her further therapy will be based on what her stages at the time of her operation.  Pt Education - [facility] breast cancer surgery: discussed with patient  and provided information.

## 2019-08-11 ENCOUNTER — Ambulatory Visit: Payer: Medicare Other | Attending: Internal Medicine

## 2019-08-11 ENCOUNTER — Other Ambulatory Visit: Payer: Self-pay | Admitting: Surgery

## 2019-08-11 DIAGNOSIS — Z23 Encounter for immunization: Secondary | ICD-10-CM

## 2019-08-11 DIAGNOSIS — C50012 Malignant neoplasm of nipple and areola, left female breast: Secondary | ICD-10-CM

## 2019-08-11 NOTE — Progress Notes (Signed)
   Covid-19 Vaccination Clinic  Name:  Jacqueline Stephenson    MRN: [id number] DOB: May 12, 1941  08/11/2019  Ms. Jacqueline Stephenson was observed post Covid-19 immunization for 15 minutes without incidence. She was provided with Vaccine Information Sheet and instruction to access the V-Safe system.   Ms. Jacqueline Stephenson was instructed to call 911 with any severe reactions post vaccine: Marland Kitchen Difficulty breathing  . Swelling of your face and throat  . A fast heartbeat  . A bad rash all over your body  . Dizziness and weakness    Immunizations Administered    Name Date Dose VIS Date Route   Pfizer COVID-19 Vaccine 08/11/2019 11:43 AM 0.3 mL 06/12/2019 Intramuscular   Manufacturer: [place]   Lot: [id number]   [place]: [id number]

## 2019-08-12 ENCOUNTER — Telehealth: Payer: Self-pay | Admitting: Oncology

## 2019-08-12 ENCOUNTER — Other Ambulatory Visit (HOSPITAL_COMMUNITY): Payer: Self-pay | Admitting: Surgery

## 2019-08-12 ENCOUNTER — Other Ambulatory Visit: Payer: Self-pay | Admitting: Surgery

## 2019-08-12 DIAGNOSIS — C50911 Malignant neoplasm of unspecified site of right female breast: Secondary | ICD-10-CM

## 2019-08-12 DIAGNOSIS — C50912 Malignant neoplasm of unspecified site of left female breast: Secondary | ICD-10-CM

## 2019-08-12 NOTE — Telephone Encounter (Signed)
Received a new pt referral from Dr. Brantley Stage for new dx of breast cancer. Pt has been cld and scheduled to seee Dr. Jana Hakim on 2/23 at 4pm w/labs at 330pm. Pt aware to arrive 15 minutes early.

## 2019-08-18 ENCOUNTER — Encounter (INDEPENDENT_AMBULATORY_CARE_PROVIDER_SITE_OTHER): Payer: Medicare Other | Admitting: Ophthalmology

## 2019-08-18 DIAGNOSIS — H43813 Vitreous degeneration, bilateral: Secondary | ICD-10-CM | POA: Diagnosis not present

## 2019-08-18 DIAGNOSIS — H35033 Hypertensive retinopathy, bilateral: Secondary | ICD-10-CM

## 2019-08-18 DIAGNOSIS — H348312 Tributary (branch) retinal vein occlusion, right eye, stable: Secondary | ICD-10-CM

## 2019-08-18 DIAGNOSIS — D3132 Benign neoplasm of left choroid: Secondary | ICD-10-CM | POA: Diagnosis not present

## 2019-08-18 DIAGNOSIS — I10 Essential (primary) hypertension: Secondary | ICD-10-CM | POA: Diagnosis not present

## 2019-08-24 ENCOUNTER — Ambulatory Visit (HOSPITAL_COMMUNITY)
Admission: RE | Admit: 2019-08-24 | Discharge: 2019-08-24 | Disposition: A | Discharge status: Home / Self Care | Payer: Medicare Other | Source: Ambulatory Visit | Attending: Surgery | Admitting: Surgery

## 2019-08-24 ENCOUNTER — Other Ambulatory Visit: Payer: Self-pay

## 2019-08-24 ENCOUNTER — Encounter: Payer: Self-pay | Admitting: Oncology

## 2019-08-24 DIAGNOSIS — Z170 Estrogen receptor positive status [ER+]: Secondary | ICD-10-CM | POA: Diagnosis not present

## 2019-08-24 DIAGNOSIS — C50919 Malignant neoplasm of unspecified site of unspecified female breast: Secondary | ICD-10-CM

## 2019-08-24 DIAGNOSIS — C50912 Malignant neoplasm of unspecified site of left female breast: Secondary | ICD-10-CM | POA: Insufficient documentation

## 2019-08-24 DIAGNOSIS — C50911 Malignant neoplasm of unspecified site of right female breast: Secondary | ICD-10-CM | POA: Diagnosis not present

## 2019-08-24 MED ORDER — GADOBUTROL 1 MMOL/ML IV SOLN
10.0000 mL | Freq: Once | INTRAVENOUS | Status: AC | PRN
  Administered 2019-08-24: 10 mL via INTRAVENOUS

## 2019-08-24 NOTE — Progress Notes (Signed)
[place]  Telephone:(336) [phone] Fax:(336) [phone]     ID: Jacqueline Stephenson DOB: 1941/06/08  MR#: [id number]  PNT#:[id number]  Patient Care Team: Gaynelle Arabian, MD as PCP - General (Family Medicine) Erroll Luna, MD as Consulting Physician (General Surgery) Tank Difiore, Virgie Dad, MD as Consulting Physician (Oncology) Marylynn Pearson, MD as Consulting Physician (Obstetrics and Gynecology) Gery Pray, MD as Consulting Physician (Radiation Oncology) Chauncey Cruel, MD OTHER MD:  CHIEF COMPLAINT: Estrogen receptor positive lobular breast cancer  CURRENT TREATMENT: Awaiting definitive surgery   HISTORY OF CURRENT ILLNESS: Jacqueline Stephenson presented for her annual mammogram at [facility] on 07/30/2019 with left nipple inversion. She denied palpating any masses. She underwent bilateral diagnostic mammography with tomography and left breast ultrasonography showing: breast density category B; suspicious 0.9 cm mass at 6-7 o'clock in the left breast; 3 mm benign cyst in the outer retroareolar left breast at 3 o'clock; left axilla negative for lymphadenopathy.  Accordingly on 08/06/2019 she proceeded to biopsy of the left breast area in question. The pathology from this procedure ([id number]) showed: invasive mammary carcinoma, grade 2, e-cadherin negative. Prognostic indicators significant for: estrogen receptor, 95% positive and progesterone receptor, 30% positive, both with strong staining intensity. Proliferation marker Ki67 at 5%. HER2 equivocal by immunohistochemistry (2+), but negative by fluorescent in situ hybridization with a signals ratio 1.4 and number per cell 1.75.  She underwent breast MRI yesterday, 08/24/2019, which showed: No suspicious masses in the right breast no abnormal appearing lymph nodes complex mass in the lower outer left breast which is postbiopsy change and hematoma primarily.  She has an appointment with Dr. Sondra Come 08/26/2019.   She is scheduled for left lumpectomy on 09/08/2019 under Dr. Brantley Stage.  The patient's subsequent history is as detailed below.   INTERVAL HISTORY: Jacqueline Stephenson was evaluated in the breast cancer clinic on 08/25/2019 accompanied by her husband Jacqueline Stephenson. Her case was also presented at the multidisciplinary breast cancer conference on 08/12/2019. At that time a preliminary plan was proposed: Breast conserving surgery with no sentinel lymph node sampling, consideration of adjuvant radiation, antiestrogens   REVIEW OF SYSTEMS: The patient denies unusual headaches, visual changes, nausea, vomiting, stiff neck, dizziness, or gait imbalance. There has been no cough, phlegm production, or pleurisy, no chest pain or pressure, and no change in bowel or bladder habits. The patient denies fever, rash, bleeding, unexplained fatigue or unexplained weight loss.  She has significant bilateral shoulder pain which has not responded well to injections.  This is limiting her exercise capacity at present.  A detailed review of systems was otherwise entirely negative.   PAST MEDICAL HISTORY: Past Medical History:  Diagnosis Date   Arthritis    Diabetes mellitus without complication (HCC)    GERD (gastroesophageal reflux disease)    High cholesterol    Hypertension    Shingles    Vertigo     PAST SURGICAL HISTORY: Past Surgical History:  Procedure Laterality Date   BREAST EXCISIONAL BIOPSY      FAMILY HISTORY: Family History  Problem Relation Age of Onset   Heart disease Mother    Heart disease Stephenson    Jacqueline Stephenson died in his late 51s from heart disease.  He was a smoker.  The patient's mother died at age 76 also from heart disease.  She was not a smoker.  There is no breast ovarian pancreatic or prostate cancer in the family to the patient's knowledge.  The patient has no brothers or sisters.  GYNECOLOGIC HISTORY:  No LMP recorded. Patient is postmenopausal. Menarche: 79 years old Age at first  live birth: 79 years old [place] P 1 LMP late 43s Contraceptive: Oral contraceptives less than 1 year HRT no  Hysterectomy? no BSO?  No   SOCIAL HISTORY: (updated 08/2019)  Jacqueline Stephenson worked at [employer] and light as did her husband Risk manager.  He is a former [employer] fast streamer.  They are both retired.  They have been married more than 50 years.  Their child Jacqueline Stephenson, 42 years old, lives in [place] and works usually for [employer]  Jacqueline Stephenson has a child from an earlier marriage.  The patient has 2 grandchildren, 1 but N1 bilateral.  She attends a local [employer] DIRECTIVES: In the absence of any documentation to the contrary, the patient's spouse is their HCPOA.    HEALTH MAINTENANCE: Social History   Tobacco Use   Smoking status: Never Smoker   Smokeless tobacco: Never Used  Substance Use Topics   Alcohol use: Yes    Comment: Socially   Drug use: Not Currently     Colonoscopy: [facility]  PAP: 2021  Bone density: "Normal" per patient   Allergies  Allergen Reactions   Oxycodone Nausea Only    Current Outpatient Medications  Medication Sig Dispense Refill   aspirin EC 81 MG tablet Take 81 mg by mouth daily.     calcium carbonate (CALCIUM 600) 600 MG TABS tablet Take 600 mg by mouth daily.     calcium carbonate (TUMS - DOSED IN MG ELEMENTAL CALCIUM) 500 MG chewable tablet Chew 2-4 tablets by mouth as needed for indigestion or heartburn.     cholecalciferol (VITAMIN D) 1000 units tablet Take 2,000 Units by mouth daily.     lisinopril (PRINIVIL,ZESTRIL) 20 MG tablet Take 20 mg by mouth daily.      Multiple Vitamin (MULTIVITAMIN WITH MINERALS) TABS tablet Take 1 tablet by mouth daily.     rosuvastatin (CRESTOR) 10 MG tablet Take 10 mg by mouth every evening.  0   tolterodine (DETROL LA) 4 MG 24 hr capsule Take 4 mg by mouth daily.     valACYclovir (VALTREX) 1000 MG tablet valacyclovir 1 gram tablet     vitamin E 100 UNIT capsule Take 100 Units by mouth  daily.     Zoster Vaccine Adjuvanted [facility]) injection Shingrix (PF) 50 mcg/0.5 mL intramuscular suspension, kit     No current facility-administered medications for this visit.    OBJECTIVE: Middle-aged white woman who appears younger than stated age  64:   08/25/19 1543  BP: (!) 166/88  Pulse: 94  Resp: 18  Temp: 97.8 F (36.6 C)  SpO2: 96%     Body mass index is 30.75 kg/m.   Wt Readings from Last 3 Encounters:  08/25/19 173 lb 9.6 oz (78.7 kg)  11/26/17 165 lb (74.8 kg)  09/29/15 165 lb (74.8 kg)      ECOG FS:1 - Symptomatic but completely ambulatory  Ocular: Sclerae unicteric, pupils round and equal Ear-nose-throat: Wearing a mask Lymphatic: No cervical or supraclavicular adenopathy Lungs no rales or rhonchi Heart regular rate and rhythm Abd soft, nontender, positive bowel sounds MSK no focal spinal tenderness, no joint edema Neuro: non-focal, well-oriented, appropriate affect Breasts: The right breast is unremarkable.  The left breast is status post recent biopsy.  There is a significant ecchymosis.  The hematoma is palpable in the lower outer quadrant.  Both axillae are benign.   LAB RESULTS:  CMP     Component Value Date/Time   NA 141 08/25/2019 1524   K 4.3 08/25/2019 1524   CL 105 08/25/2019 1524   CO2 29 08/25/2019 1524   GLUCOSE 124 (H) 08/25/2019 1524   BUN 17 08/25/2019 1524   CREATININE 1.08 (H) 08/25/2019 1524   CALCIUM 9.6 08/25/2019 1524   PROT 7.4 08/25/2019 1524   ALBUMIN 3.9 08/25/2019 1524   AST 19 08/25/2019 1524   ALT 18 08/25/2019 1524   ALKPHOS 46 08/25/2019 1524   BILITOT 0.4 08/25/2019 1524   GFRNONAA 49 (L) 08/25/2019 1524   GFRAA 57 (L) 08/25/2019 1524    No results found for: TOTALPROTELP, ALBUMINELP, A1GS, A2GS, BETS, BETA2SER, GAMS, MSPIKE, SPEI  Lab Results  Component Value Date   WBC 6.7 08/25/2019   NEUTROABS 3.7 08/25/2019   HGB 13.9 08/25/2019   HCT 41.5 08/25/2019   MCV 91.0 08/25/2019   PLT 231  08/25/2019    No results found for: LABCA2  No components found for: URKYHC623  No results for input(s): INR in the last 168 hours.  No results found for: LABCA2  No results found for: JSE831  No results found for: DVV616  No results found for: WVP710  No results found for: CA2729  No components found for: HGQUANT  No results found for: CEA1 / No results found for: CEA1   No results found for: AFPTUMOR  No results found for: CHROMOGRNA  No results found for: KPAFRELGTCHN, LAMBDASER, KAPLAMBRATIO (kappa/lambda light chains)  No results found for: HGBA, HGBA2QUANT, HGBFQUANT, HGBSQUAN (Hemoglobinopathy evaluation)   No results found for: LDH  No results found for: IRON, TIBC, IRONPCTSAT (Iron and TIBC)  No results found for: FERRITIN  Urinalysis    Component Value Date/Time   COLORURINE YELLOW 12/02/2017 1243   APPEARANCEUR CLEAR 12/02/2017 1243   LABSPEC 1.014 12/02/2017 1243   PHURINE 6.0 12/02/2017 1243   GLUCOSEU NEGATIVE 12/02/2017 1243   HGBUR NEGATIVE 12/02/2017 1243   BILIRUBINUR NEGATIVE 12/02/2017 1243   KETONESUR NEGATIVE 12/02/2017 1243   PROTEINUR NEGATIVE 12/02/2017 1243   NITRITE NEGATIVE 12/02/2017 1243   LEUKOCYTESUR TRACE (A) 12/02/2017 1243     STUDIES: MR BREAST BILATERAL W WO CONTRAST INC CAD  Result Date: 08/25/2019 CLINICAL DATA:  79 year old female with newly diagnosed left breast cancer. LABS:  None performed on site. EXAM: BILATERAL BREAST MRI WITH AND WITHOUT CONTRAST TECHNIQUE: Multiplanar, multisequence MR images of both breasts were obtained prior to and following the intravenous administration of 10 ml of Gadavist. Three-dimensional MR images were rendered by post-processing of the original MR data on an independent workstation. The three-dimensional MR images were interpreted, and findings are reported in the following complete MRI report for this study. Three dimensional images were evaluated at the independent DynaCad  workstation COMPARISON:  Previous exam(s). FINDINGS: Breast composition: a. Almost entirely fat. Background parenchymal enhancement: Mild. Right breast: No suspicious mass or abnormal enhancement. Left breast: A complex, heterogenously enhancing mass is identified in the lower outer quadrant at anterior depth, likely representing post biopsy changes and resolving hematoma. Susceptibility artifact from post biopsy clip is noted along the central and medial aspect (series 7, image 84/112), likely representing the focal site of the patient's primary malignancy. No additional suspicious mass or enhancement is identified within the remainder of the left breast. Lymph nodes: No abnormal appearing lymph nodes. Ancillary findings:  None. IMPRESSION: 1. Biopsy proven malignancy in the lower left breast with associated complex, heterogenously enhancing mass in the lower  outer quadrant anteriorly. The majority of this likely represents post biopsy changes/resolving hematoma. 2. No additional suspicious findings in the remainder of the left breast. 3. No MRI evidence of malignancy on the right. 4. No suspicious lymphadenopathy. RECOMMENDATION: Per clinical treatment plan. BI-RADS CATEGORY  6: Known biopsy-proven malignancy. Electronically Signed   By: Kristopher Oppenheim M.D.   On: 08/25/2019 11:09   US BREAST LTD UNI LEFT INC AXILLA  Result Date: 07/30/2019 CLINICAL DATA:  79 year old patient presents for annual mammogram and evaluation of left nipple inversion. She does not palpate a mass in either breast. EXAM: DIGITAL DIAGNOSTIC BILATERAL MAMMOGRAM WITH CAD AND TOMO ULTRASOUND LEFT BREAST COMPARISON:  Previous exam(s). ACR Breast Density Category b: There are scattered areas of fibroglandular density. FINDINGS: The left nipple appears inverted on the mammogram. Mammographically, left nipple inversion appears to be a chronic finding, but nipple inversion is best assessed on clinical exam. A new asymmetry is identified in the  inferior slightly medial left breast on today's mammogram and evaluated with spot compression views. These views confirm an irregular mass with indistinct margins measuring approximately 0.9 cm. Additionally, in the slightly outer retroareolar left breast is a circumscribed nodule measuring 4 mm. No suspicious findings identified in the right breast. Mammographic images were processed with CAD. On physical exam, there is central left nipple inversion. No mass is palpated in the left breast. No skin retraction is seen. Targeted ultrasound is performed, showing an irregular heterogeneously hypoechoic mass at 6 o'clock position left breast 3 cm from the nipple measuring 0.9 x 0.6 x 0.4 cm, with internal vascular flow. Benign cyst is noted in the outer retroareolar left breast 3 o'clock position 1 cm from the nipple measuring 3 x 2 x 2 mm, accounting for a small circumscribed mass seen on the mammogram. Ultrasound of the left axilla is negative for lymphadenopathy. IMPRESSION: Suspicious 0.9 cm mass 6-7 o'clock position left breast. No evidence of malignancy in the right breast. RECOMMENDATION: Ultrasound-guided core needle biopsy of the left breast mass is recommended and has been scheduled for the patient. I have discussed the findings and recommendations with the patient. If applicable, a reminder letter will be sent to the patient regarding the next appointment. BI-RADS CATEGORY  4: Suspicious. Electronically Signed   By: Curlene Dolphin M.D.   On: 07/30/2019 16:14   MM DIAG BREAST TOMO BILATERAL  Result Date: 07/30/2019 CLINICAL DATA:  79 year old patient presents for annual mammogram and evaluation of left nipple inversion. She does not palpate a mass in either breast. EXAM: DIGITAL DIAGNOSTIC BILATERAL MAMMOGRAM WITH CAD AND TOMO ULTRASOUND LEFT BREAST COMPARISON:  Previous exam(s). ACR Breast Density Category b: There are scattered areas of fibroglandular density. FINDINGS: The left nipple appears inverted on  the mammogram. Mammographically, left nipple inversion appears to be a chronic finding, but nipple inversion is best assessed on clinical exam. A new asymmetry is identified in the inferior slightly medial left breast on today's mammogram and evaluated with spot compression views. These views confirm an irregular mass with indistinct margins measuring approximately 0.9 cm. Additionally, in the slightly outer retroareolar left breast is a circumscribed nodule measuring 4 mm. No suspicious findings identified in the right breast. Mammographic images were processed with CAD. On physical exam, there is central left nipple inversion. No mass is palpated in the left breast. No skin retraction is seen. Targeted ultrasound is performed, showing an irregular heterogeneously hypoechoic mass at 6 o'clock position left breast 3 cm from the nipple measuring 0.9  x 0.6 x 0.4 cm, with internal vascular flow. Benign cyst is noted in the outer retroareolar left breast 3 o'clock position 1 cm from the nipple measuring 3 x 2 x 2 mm, accounting for a small circumscribed mass seen on the mammogram. Ultrasound of the left axilla is negative for lymphadenopathy. IMPRESSION: Suspicious 0.9 cm mass 6-7 o'clock position left breast. No evidence of malignancy in the right breast. RECOMMENDATION: Ultrasound-guided core needle biopsy of the left breast mass is recommended and has been scheduled for the patient. I have discussed the findings and recommendations with the patient. If applicable, a reminder letter will be sent to the patient regarding the next appointment. BI-RADS CATEGORY  4: Suspicious. Electronically Signed   By: Curlene Dolphin M.D.   On: 07/30/2019 16:14   MM CLIP PLACEMENT LEFT  Result Date: 08/06/2019 CLINICAL DATA:  Status post ultrasound-guided biopsy of a LEFT breast mass. EXAM: DIAGNOSTIC LEFT MAMMOGRAM POST ULTRASOUND BIOPSY COMPARISON:  Previous exam(s). FINDINGS: Mammographic images were obtained following ultrasound  guided biopsy of the LEFT breast mass at the 6:30 o'clock axis. The biopsy marking clip is in expected position at the site of biopsy. IMPRESSION: Appropriate positioning of the ribbon shaped biopsy marking clip at the site of biopsy in the lower inner quadrant of the LEFT breast Patient has a large post biopsy hematoma. Compression bandages were applied. Final Assessment: Post Procedure Mammograms for Marker Placement Electronically Signed   By: Franki Cabot M.D.   On: 08/06/2019 14:26   [place] LT BREAST BX W LOC DEV 1ST LESION IMG BX SPEC US GUIDE  Addendum Date: 08/07/2019   ADDENDUM REPORT: 08/07/2019 14:37 ADDENDUM: Pathology revealed GRADE II INVASIVE MAMMARY CARCINOMA of the Left breast, 6:30 o'clock. This was found to be concordant by Dr. Franki Cabot. Pathology results were discussed with the patient by telephone. The patient reported doing well after the biopsy with tenderness, swelling, bleeding and bruising at the site. Post biopsy instructions and care were reviewed and questions were answered. The patient was encouraged to call [facility] for any additional concerns. Surgical consultation has been arranged with Dr. Erroll Luna at [facility] on August 10, 2019. Pathology results reported by Terie Purser, RN on 08/07/2019. Electronically Signed   By: Franki Cabot M.D.   On: 08/07/2019 14:37   Addendum Date: 08/06/2019   ADDENDUM REPORT: 08/06/2019 14:28 ADDENDUM: Patient had a significant postprocedure hematoma. Pressure was applied until hemostasis was achieved. Compression bandages were applied. Electronically Signed   By: Franki Cabot M.D.   On: 08/06/2019 14:28   Result Date: 08/07/2019 CLINICAL DATA:  Patient presents today for ultrasound-guided core biopsy of a LEFT breast mass at the 6-7 o'clock axis. EXAM: ULTRASOUND GUIDED LEFT BREAST CORE NEEDLE BIOPSY COMPARISON:  Previous exam(s). FINDINGS: I met with the patient and we discussed the procedure  of ultrasound-guided biopsy, including benefits and alternatives. We discussed the high likelihood of a successful procedure. We discussed the risks of the procedure, including infection, bleeding, tissue injury, clip migration, and inadequate sampling. Informed written consent was given. The usual time-out protocol was performed immediately prior to the procedure. Lesion quadrant: Lower inner quadrant Using sterile technique and 1% Lidocaine as local anesthetic, under direct ultrasound visualization, a 12 gauge spring-loaded device was used to perform biopsy of the suspicious mass within the LEFT breast at the 6:30 o'clock axis using a lateral approach. At the conclusion of the procedure ribbon shaped tissue marker clip was deployed into  the biopsy cavity. Follow up 2 view mammogram was performed and dictated separately. IMPRESSION: Ultrasound guided biopsy of the LEFT breast mass at the 6:30 o'clock axis. No apparent complications. Electronically Signed: By: Franki Cabot M.D. On: 08/06/2019 13:31     ELIGIBLE FOR AVAILABLE RESEARCH PROTOCOL: AET  ASSESSMENT: 79 y.o. [place] woman status post left breast lower inner quadrant biopsy 08/06/2019 for a clinical T1b N0, stage IA invasive lobular carcinoma, E-cadherin negative, grade 2, estrogen and progesterone receptor positive, HER-2 not amplified, with an MIB-1 of 5%  (1) definitive surgery pending  (2) consider adjuvant radiation  (3) antiestrogens at the completion of local treatment  PLAN: I met today with Jacqueline Stephenson to review her new diagnosis. Specifically we discussed the biology of her breast cancer, its diagnosis, staging, treatment  options and prognosis. We first reviewed the fact that cancer is not one disease but more than 100 different diseases and that it is important to keep them separate-- otherwise when friends and relatives discuss their own cancer experiences with Magda confusion can result. Similarly we explained that if breast  cancer spreads to the bone or liver, the patient would not have bone cancer or liver cancer, but breast cancer in the bone and breast cancer in the liver: one cancer in three places-- not 3 different cancers which otherwise would have to be treated in 3 different ways.  We discussed the difference between local and systemic therapy. In terms of loco-regional treatment, lumpectomy plus radiation is equivalent to mastectomy as far as survival is concerned. For this reason, and because the cosmetic results are generally superior, we recommend breast conserving surgery.   We then discussed the rationale for systemic therapy. There is some risk that this cancer may have already spread to other parts of her body. Patients frequently ask at this point about bone scans, CAT scans and PET scans to find out if they have occult breast cancer somewhere else. The problem is that in early stage disease we are much more likely to find false positives then true cancers and this would expose the patient to unnecessary procedures as well as unnecessary radiation. Scans cannot answer the question the patient really would like to know, which is whether she has microscopic disease elsewhere in her body. For those reasons we do not recommend them.  Of course we would proceed to aggressive evaluation of any symptoms that might suggest metastatic disease, but that is not the case here.  Next we went over the options for systemic therapy which are anti-estrogens, anti-HER-2 immunotherapy, and chemotherapy. Carnell does not meet criteria for anti-HER-2 immunotherapy. She is a good candidate for anti-estrogens.  The question of chemotherapy is more complicated. Chemotherapy is most effective in rapidly growing, aggressive tumors. It is much less effective in slow growing cancers, like Jacqueline Stephenson 's. For that reason we decided in conference to forego the chemotherapy option and axillary sentinel sampling.  The plan then is to proceed to  surgery.  The patient will discuss the radiation option with Dr. Randa Ngo later this week.  I will see her again in early April and at that time we will discuss antiestrogens in detail.  Jacqueline Stephenson has a good understanding of the overall plan. She agrees with it. She knows the goal of treatment in her case is cure. She will call with any problems that may develop before her next visit here.  Total encounter time 65 minutes.Chauncey Cruel, MD   08/25/2019 6:17 PM Medical Oncology  and Hematology [facility] [place], [place] [address] Tel. [phone]    Fax. [phone]   This document serves as a record of services personally performed by Lurline Del, MD. It was created on his behalf by Wilburn Mylar, a trained medical scribe. The creation of this record is based on the scribe's personal observations and the provider's statements to them.   I, Lurline Del MD, have reviewed the above documentation for accuracy and completeness, and I agree with the above.    *Total Encounter Time as defined by the Centers for Medicare and Medicaid Services includes, in addition to the face-to-face time of a patient visit (documented in the note above) non-face-to-face time: obtaining and reviewing outside history, ordering and reviewing medications, tests or procedures, care coordination (communications with other health care professionals or caregivers) and documentation in the medical record.

## 2019-08-25 ENCOUNTER — Inpatient Hospital Stay: Payer: Medicare Other

## 2019-08-25 ENCOUNTER — Inpatient Hospital Stay: Payer: Medicare Other | Attending: Oncology | Admitting: Oncology

## 2019-08-25 ENCOUNTER — Other Ambulatory Visit: Payer: Self-pay

## 2019-08-25 DIAGNOSIS — E119 Type 2 diabetes mellitus without complications: Secondary | ICD-10-CM | POA: Diagnosis not present

## 2019-08-25 DIAGNOSIS — E7800 Pure hypercholesterolemia, unspecified: Secondary | ICD-10-CM | POA: Insufficient documentation

## 2019-08-25 DIAGNOSIS — C50919 Malignant neoplasm of unspecified site of unspecified female breast: Secondary | ICD-10-CM

## 2019-08-25 DIAGNOSIS — C50312 Malignant neoplasm of lower-inner quadrant of left female breast: Secondary | ICD-10-CM | POA: Diagnosis not present

## 2019-08-25 DIAGNOSIS — Z7982 Long term (current) use of aspirin: Secondary | ICD-10-CM | POA: Diagnosis not present

## 2019-08-25 DIAGNOSIS — Z170 Estrogen receptor positive status [ER+]: Secondary | ICD-10-CM | POA: Diagnosis not present

## 2019-08-25 DIAGNOSIS — M129 Arthropathy, unspecified: Secondary | ICD-10-CM | POA: Diagnosis not present

## 2019-08-25 DIAGNOSIS — Z79899 Other long term (current) drug therapy: Secondary | ICD-10-CM | POA: Diagnosis not present

## 2019-08-25 DIAGNOSIS — K219 Gastro-esophageal reflux disease without esophagitis: Secondary | ICD-10-CM | POA: Diagnosis not present

## 2019-08-25 DIAGNOSIS — I10 Essential (primary) hypertension: Secondary | ICD-10-CM | POA: Diagnosis not present

## 2019-08-25 LAB — CBC WITH DIFFERENTIAL (CANCER CENTER ONLY)
Abs Immature Granulocytes: 0.01 10*3/uL (ref 0.00–0.07)
Basophils Absolute: 0.1 10*3/uL (ref 0.0–0.1)
Basophils Relative: 1 %
Eosinophils Absolute: 0.4 10*3/uL (ref 0.0–0.5)
Eosinophils Relative: 6 %
HCT: 41.5 % (ref 36.0–46.0)
Hemoglobin: 13.9 g/dL (ref 12.0–15.0)
Immature Granulocytes: 0 %
Lymphocytes Relative: 31 %
Lymphs Abs: 2.1 10*3/uL (ref 0.7–4.0)
MCH: 30.5 pg (ref 26.0–34.0)
MCHC: 33.5 g/dL (ref 30.0–36.0)
MCV: 91 fL (ref 80.0–100.0)
Monocytes Absolute: 0.5 10*3/uL (ref 0.1–1.0)
Monocytes Relative: 8 %
Neutro Abs: 3.7 10*3/uL (ref 1.7–7.7)
Neutrophils Relative %: 54 %
Platelet Count: 231 10*3/uL (ref 150–400)
RBC: 4.56 MIL/uL (ref 3.87–5.11)
RDW: 12.8 % (ref 11.5–15.5)
WBC Count: 6.7 10*3/uL (ref 4.0–10.5)
nRBC: 0 % (ref 0.0–0.2)

## 2019-08-25 LAB — CMP (CANCER CENTER ONLY)
ALT: 18 U/L (ref 0–44)
AST: 19 U/L (ref 15–41)
Albumin: 3.9 g/dL (ref 3.5–5.0)
Alkaline Phosphatase: 46 U/L (ref 38–126)
Anion gap: 7 (ref 5–15)
BUN: 17 mg/dL (ref 8–23)
CO2: 29 mmol/L (ref 22–32)
Calcium: 9.6 mg/dL (ref 8.9–10.3)
Chloride: 105 mmol/L (ref 98–111)
Creatinine: 1.08 mg/dL — ABNORMAL HIGH (ref 0.44–1.00)
GFR, Est AFR Am: 57 mL/min — ABNORMAL LOW (ref >60)
GFR, Estimated: 49 mL/min — ABNORMAL LOW (ref >60)
Glucose, Bld: 124 mg/dL — ABNORMAL HIGH (ref 70–99)
Potassium: 4.3 mmol/L (ref 3.5–5.1)
Sodium: 141 mmol/L (ref 135–145)
Total Bilirubin: 0.4 mg/dL (ref 0.3–1.2)
Total Protein: 7.4 g/dL (ref 6.5–8.1)

## 2019-08-25 NOTE — Progress Notes (Signed)
Radiation Oncology         [phone] ________________________________  Initial Outpatient Consultation  Name: Jacqueline Stephenson MRN: [id number]  Date: 08/26/2019  DOB: 1940-10-31  CC:Jacqueline Arabian, MD  Erroll Luna, MD   REFERRING PHYSICIAN: Erroll Luna, MD  DIAGNOSIS: The encounter diagnosis was Malignant neoplasm of lower-inner quadrant of left breast in female, estrogen receptor positive ([place]).  Clinical stage IA (T1b, N0) Left Breast LOQ, Invasive lobular Carcinoma, ER+ / PR+ / Her2-, Grade 2  HISTORY OF PRESENT ILLNESS::Jacqueline Stephenson is a 78 y.o. female who is accompanied by husband. She underwent a bilateral diagnostic mammography and left breast ultrasonography on 07/30/2019 for evaluation of left nipple inversion. Results showed a suspicious 0.9 cm mass at the 6-7 o'clock position of the left breast. No evidence of malignancy was seen in the right breast.  Biopsy on 08/06/2019 showed grade 2 invasive lobular carcinoma. Prognostic indicators significant for estrogen receptor, 95% positive and progesterone receptor, 30% positive, both with strong staining intensities. Proliferation marker Ki67 at 5%. HER2 negative.  The patient underwent a bilateral breast MRI on 08/24/2019 that showed biopsy proven malignancy in the lower left breast with associated complex, heterogeneously enhancing mass in the lower outer quadrant anteriorly. The majority of it likely represents post biopsy changes/resolving hematoma. There were no additional suspicious findings in the remainder of the breast, no MRI evidence of malignancy in the right breast, and no suspicious lymphadenopathy.  The patient was seen by Dr. Jana Hakim yesterday. Definitive surgery is pending. They discussed adjuvant radiation and antiestrogens at the completion of local treatment.  PREVIOUS RADIATION THERAPY: No  PAST MEDICAL HISTORY:  Past Medical History:  Diagnosis Date  . Arthritis   . Diabetes mellitus  without complication ([place])   . GERD (gastroesophageal reflux disease)   . High cholesterol   . Hypertension   . Shingles   . Vertigo     PAST SURGICAL HISTORY: Past Surgical History:  Procedure Laterality Date  . BREAST EXCISIONAL BIOPSY      FAMILY HISTORY:  Family History  Problem Relation Age of Onset  . Heart disease Mother   . Heart disease Father     SOCIAL HISTORY:  Social History   Tobacco Use  . Smoking status: Never Smoker  . Smokeless tobacco: Never Used  Substance Use Topics  . Alcohol use: Yes    Comment: Socially  . Drug use: Not Currently    ALLERGIES:  Allergies  Allergen Reactions  . Oxycodone Nausea Only    MEDICATIONS:  Current Outpatient Medications  Medication Sig Dispense Refill  . aspirin EC 81 MG tablet Take 81 mg by mouth daily.    . calcium carbonate (CALCIUM 600) 600 MG TABS tablet Take 600 mg by mouth daily.    . calcium carbonate (TUMS - DOSED IN MG ELEMENTAL CALCIUM) 500 MG chewable tablet Chew 2-4 tablets by mouth as needed for indigestion or heartburn.    . cholecalciferol (VITAMIN D) 1000 units tablet Take 2,000 Units by mouth daily.    Marland Kitchen lisinopril (PRINIVIL,ZESTRIL) 20 MG tablet Take 20 mg by mouth daily.     . Multiple Vitamin (MULTIVITAMIN WITH MINERALS) TABS tablet Take 1 tablet by mouth daily.    . rosuvastatin (CRESTOR) 10 MG tablet Take 10 mg by mouth every evening.  0  . tolterodine (DETROL LA) 4 MG 24 hr capsule Take 4 mg by mouth daily.    . vitamin E 100 UNIT capsule Take 100 Units by mouth daily.  No current facility-administered medications for this encounter.    REVIEW OF SYSTEMS:  A 10+ POINT REVIEW OF SYSTEMS WAS OBTAINED including neurology, dermatology, psychiatry, cardiac, respiratory, lymph, extremities, GI, GU, musculoskeletal, constitutional, reproductive, HEENT.  Patient reported nipple inversion noted.  Of several weeks.  She denies any pain in the breast area nipple discharge or bleeding.  Patient  reports severe  vertigo when lying completely flat.  She also reports severe arthritis involving both shoulders more so on the left side.   PHYSICAL EXAM:  height is '5\' 3"'  (1.6 m) and weight is 171 lb 6 oz (77.7 kg). Her temporal temperature is 97.8 F (36.6 C). Her blood pressure is 157/82 (abnormal) and her pulse is 72. Her respiration is 18 and oxygen saturation is 100%.   General: Alert and oriented, in no acute distress HEENT: Head is normocephalic. Extraocular movements are intact.  Neck: Neck is supple, no palpable cervical or supraclavicular lymphadenopathy. Heart: Regular in rate and rhythm with no murmurs, rubs, or gallops. Chest: Clear to auscultation bilaterally, with no rhonchi, wheezes, or rales. Abdomen: Soft, nontender, nondistended, with no rigidity or guarding. Extremities: No cyanosis or edema. Lymphatics: see Neck Exam Skin: No concerning lesions. Musculoskeletal: symmetric strength and muscle tone throughout. Neurologic: Cranial nerves II through XII are grossly intact. No obvious focalities. Speech is fluent. Coordination is intact. Psychiatric: Judgment and insight are intact. Affect is appropriate. Right breast: No palpable mass, nipple discharge, or bleeding. Left breast: The nipple is inverted.  Patient has bruising throughout the lower aspect of the breast.  Approximately 5:30 position of the breast patient has a palpable induration consistent with biopsy changes. Due to arthritis issues in the left shoulder the patient is not able to place her arm in the acceptable treatment position for radiation therapy  ECOG = 1  0 - Asymptomatic (Fully active, able to carry on all predisease activities without restriction)  1 - Symptomatic but completely ambulatory (Restricted in physically strenuous activity but ambulatory and able to carry out work of a light or sedentary nature. For example, light housework, office work)  2 - Symptomatic, <50% in bed during the day  (Ambulatory and capable of all self care but unable to carry out any work activities. Up and about more than 50% of waking hours)  3 - Symptomatic, >50% in bed, but not bedbound (Capable of only limited self-care, confined to bed or chair 50% or more of waking hours)  4 - Bedbound (Completely disabled. Cannot carry on any self-care. Totally confined to bed or chair)  5 - Death   Eustace Pen MM, Creech RH, Tormey DC, et al. [phone]). "Toxicity and response criteria of the [facility]". [place] Oncol. 5 (6): 649-55  LABORATORY DATA:  Lab Results  Component Value Date   WBC 6.7 08/25/2019   HGB 13.9 08/25/2019   HCT 41.5 08/25/2019   MCV 91.0 08/25/2019   PLT 231 08/25/2019   NEUTROABS 3.7 08/25/2019   Lab Results  Component Value Date   NA 141 08/25/2019   K 4.3 08/25/2019   CL 105 08/25/2019   CO2 29 08/25/2019   GLUCOSE 124 (H) 08/25/2019   CREATININE 1.08 (H) 08/25/2019   CALCIUM 9.6 08/25/2019      RADIOGRAPHY: MR BREAST BILATERAL W WO CONTRAST INC CAD  Result Date: 08/25/2019 CLINICAL DATA:  79 year old female with newly diagnosed left breast cancer. LABS:  None performed on site. EXAM: BILATERAL BREAST MRI WITH AND WITHOUT CONTRAST TECHNIQUE: Multiplanar, multisequence  MR images of both breasts were obtained prior to and following the intravenous administration of 10 ml of Gadavist. Three-dimensional MR images were rendered by post-processing of the original MR data on an independent workstation. The three-dimensional MR images were interpreted, and findings are reported in the following complete MRI report for this study. Three dimensional images were evaluated at the independent DynaCad workstation COMPARISON:  Previous exam(s). FINDINGS: Breast composition: a. Almost entirely fat. Background parenchymal enhancement: Mild. Right breast: No suspicious mass or abnormal enhancement. Left breast: A complex, heterogenously enhancing mass is identified in the  lower outer quadrant at anterior depth, likely representing post biopsy changes and resolving hematoma. Susceptibility artifact from post biopsy clip is noted along the central and medial aspect (series 7, image 84/112), likely representing the focal site of the patient's primary malignancy. No additional suspicious mass or enhancement is identified within the remainder of the left breast. Lymph nodes: No abnormal appearing lymph nodes. Ancillary findings:  None. IMPRESSION: 1. Biopsy proven malignancy in the lower left breast with associated complex, heterogenously enhancing mass in the lower outer quadrant anteriorly. The majority of this likely represents post biopsy changes/resolving hematoma. 2. No additional suspicious findings in the remainder of the left breast. 3. No MRI evidence of malignancy on the right. 4. No suspicious lymphadenopathy. RECOMMENDATION: Per clinical treatment plan. BI-RADS CATEGORY  6: Known biopsy-proven malignancy. Electronically Signed   By: Kristopher Oppenheim M.D.   On: 08/25/2019 11:09   US BREAST LTD UNI LEFT INC AXILLA  Result Date: 07/30/2019 CLINICAL DATA:  79 year old patient presents for annual mammogram and evaluation of left nipple inversion. She does not palpate a mass in either breast. EXAM: DIGITAL DIAGNOSTIC BILATERAL MAMMOGRAM WITH CAD AND TOMO ULTRASOUND LEFT BREAST COMPARISON:  Previous exam(s). ACR Breast Density Category b: There are scattered areas of fibroglandular density. FINDINGS: The left nipple appears inverted on the mammogram. Mammographically, left nipple inversion appears to be a chronic finding, but nipple inversion is best assessed on clinical exam. A new asymmetry is identified in the inferior slightly medial left breast on today's mammogram and evaluated with spot compression views. These views confirm an irregular mass with indistinct margins measuring approximately 0.9 cm. Additionally, in the slightly outer retroareolar left breast is a  circumscribed nodule measuring 4 mm. No suspicious findings identified in the right breast. Mammographic images were processed with CAD. On physical exam, there is central left nipple inversion. No mass is palpated in the left breast. No skin retraction is seen. Targeted ultrasound is performed, showing an irregular heterogeneously hypoechoic mass at 6 o'clock position left breast 3 cm from the nipple measuring 0.9 x 0.6 x 0.4 cm, with internal vascular flow. Benign cyst is noted in the outer retroareolar left breast 3 o'clock position 1 cm from the nipple measuring 3 x 2 x 2 mm, accounting for a small circumscribed mass seen on the mammogram. Ultrasound of the left axilla is negative for lymphadenopathy. IMPRESSION: Suspicious 0.9 cm mass 6-7 o'clock position left breast. No evidence of malignancy in the right breast. RECOMMENDATION: Ultrasound-guided core needle biopsy of the left breast mass is recommended and has been scheduled for the patient. I have discussed the findings and recommendations with the patient. If applicable, a reminder letter will be sent to the patient regarding the next appointment. BI-RADS CATEGORY  4: Suspicious. Electronically Signed   By: Curlene Dolphin M.D.   On: 07/30/2019 16:14   MM DIAG BREAST TOMO BILATERAL  Result Date: 07/30/2019 CLINICAL DATA:  79 year old patient presents for annual mammogram and evaluation of left nipple inversion. She does not palpate a mass in either breast. EXAM: DIGITAL DIAGNOSTIC BILATERAL MAMMOGRAM WITH CAD AND TOMO ULTRASOUND LEFT BREAST COMPARISON:  Previous exam(s). ACR Breast Density Category b: There are scattered areas of fibroglandular density. FINDINGS: The left nipple appears inverted on the mammogram. Mammographically, left nipple inversion appears to be a chronic finding, but nipple inversion is best assessed on clinical exam. A new asymmetry is identified in the inferior slightly medial left breast on today's mammogram and evaluated with  spot compression views. These views confirm an irregular mass with indistinct margins measuring approximately 0.9 cm. Additionally, in the slightly outer retroareolar left breast is a circumscribed nodule measuring 4 mm. No suspicious findings identified in the right breast. Mammographic images were processed with CAD. On physical exam, there is central left nipple inversion. No mass is palpated in the left breast. No skin retraction is seen. Targeted ultrasound is performed, showing an irregular heterogeneously hypoechoic mass at 6 o'clock position left breast 3 cm from the nipple measuring 0.9 x 0.6 x 0.4 cm, with internal vascular flow. Benign cyst is noted in the outer retroareolar left breast 3 o'clock position 1 cm from the nipple measuring 3 x 2 x 2 mm, accounting for a small circumscribed mass seen on the mammogram. Ultrasound of the left axilla is negative for lymphadenopathy. IMPRESSION: Suspicious 0.9 cm mass 6-7 o'clock position left breast. No evidence of malignancy in the right breast. RECOMMENDATION: Ultrasound-guided core needle biopsy of the left breast mass is recommended and has been scheduled for the patient. I have discussed the findings and recommendations with the patient. If applicable, a reminder letter will be sent to the patient regarding the next appointment. BI-RADS CATEGORY  4: Suspicious. Electronically Signed   By: Curlene Dolphin M.D.   On: 07/30/2019 16:14   MM CLIP PLACEMENT LEFT  Result Date: 08/06/2019 CLINICAL DATA:  Status post ultrasound-guided biopsy of a LEFT breast mass. EXAM: DIAGNOSTIC LEFT MAMMOGRAM POST ULTRASOUND BIOPSY COMPARISON:  Previous exam(s). FINDINGS: Mammographic images were obtained following ultrasound guided biopsy of the LEFT breast mass at the 6:30 o'clock axis. The biopsy marking clip is in expected position at the site of biopsy. IMPRESSION: Appropriate positioning of the ribbon shaped biopsy marking clip at the site of biopsy in the lower inner  quadrant of the LEFT breast Patient has a large post biopsy hematoma. Compression bandages were applied. Final Assessment: Post Procedure Mammograms for Marker Placement Electronically Signed   By: Franki Cabot M.D.   On: 08/06/2019 14:26   [place] LT BREAST BX W LOC DEV 1ST LESION IMG BX SPEC US GUIDE  Addendum Date: 08/07/2019   ADDENDUM REPORT: 08/07/2019 14:37 ADDENDUM: Pathology revealed GRADE II INVASIVE MAMMARY CARCINOMA of the Left breast, 6:30 o'clock. This was found to be concordant by Dr. Franki Cabot. Pathology results were discussed with the patient by telephone. The patient reported doing well after the biopsy with tenderness, swelling, bleeding and bruising at the site. Post biopsy instructions and care were reviewed and questions were answered. The patient was encouraged to call [facility] for any additional concerns. Surgical consultation has been arranged with Dr. Erroll Luna at [facility] on August 10, 2019. Pathology results reported by Terie Purser, RN on 08/07/2019. Electronically Signed   By: Franki Cabot M.D.   On: 08/07/2019 14:37   Addendum Date: 08/06/2019   ADDENDUM REPORT: 08/06/2019 14:28 ADDENDUM: Patient had  a significant postprocedure hematoma. Pressure was applied until hemostasis was achieved. Compression bandages were applied. Electronically Signed   By: Franki Cabot M.D.   On: 08/06/2019 14:28   Result Date: 08/07/2019 CLINICAL DATA:  Patient presents today for ultrasound-guided core biopsy of a LEFT breast mass at the 6-7 o'clock axis. EXAM: ULTRASOUND GUIDED LEFT BREAST CORE NEEDLE BIOPSY COMPARISON:  Previous exam(s). FINDINGS: I met with the patient and we discussed the procedure of ultrasound-guided biopsy, including benefits and alternatives. We discussed the high likelihood of a successful procedure. We discussed the risks of the procedure, including infection, bleeding, tissue injury, clip migration, and inadequate sampling.  Informed written consent was given. The usual time-out protocol was performed immediately prior to the procedure. Lesion quadrant: Lower inner quadrant Using sterile technique and 1% Lidocaine as local anesthetic, under direct ultrasound visualization, a 12 gauge spring-loaded device was used to perform biopsy of the suspicious mass within the LEFT breast at the 6:30 o'clock axis using a lateral approach. At the conclusion of the procedure ribbon shaped tissue marker clip was deployed into the biopsy cavity. Follow up 2 view mammogram was performed and dictated separately. IMPRESSION: Ultrasound guided biopsy of the LEFT breast mass at the 6:30 o'clock axis. No apparent complications. Electronically Signed: By: Franki Cabot M.D. On: 08/06/2019 13:31      IMPRESSION: Clinical stage IA (T1b, N0) Left Breast LOQ, Invasive lobular Carcinoma, ER+ / PR+ / Her2-, Grade 2  Patient would be a good candidate for lumpectomy  for management of her early stage breast cancer.  Patient is interested in breast conserving surgery. Unfortunately patient has severe arthritis in her left shoulder and I do not feel that she will be able to  get in a resonable treatment position for radiation therapy.  We attempted this examination today.  Given the patient's age,  tumor characteristics and apparent early stage she would appear to be one of this patients that can safely avoid adjuvant radiation therapy assuming she proceeds with adjuvant hormonal therapy as recommended by Dr. Jana Hakim..  We discussed the results of the recent Prime II study update  Documenting the benefit of radiation therapy in terms of local control by approximately 10% but no benefit in overall survival in this situation as long as she proceeds with hormonal therapy.  At this time the patient feels comfortable with proceeding with adjuvant hormonal therapy alone after her breast conserving surgery.  I would be glad to see her there is a significant change in  her stage after surgery but do not anticipate this being an issue.    PLAN: Lumpectomy followed by adjuvant hormonal therapy.    ------------------------------------------------  Blair Promise, PhD, MD  This document serves as a record of services personally performed by Gery Pray, MD. It was created on his behalf by Clerance Lav, a trained medical scribe. The creation of this record is based on the scribe's personal observations and the provider's statements to them. This document has been checked and approved by the attending provider.

## 2019-08-26 ENCOUNTER — Encounter: Payer: Self-pay | Admitting: Radiation Oncology

## 2019-08-26 ENCOUNTER — Encounter: Payer: Self-pay | Admitting: *Deleted

## 2019-08-26 ENCOUNTER — Ambulatory Visit
Admission: RE | Admit: 2019-08-26 | Discharge: 2019-08-26 | Disposition: A | Discharge status: Home / Self Care | Payer: Medicare Other | Source: Ambulatory Visit | Attending: Radiation Oncology | Admitting: Radiation Oncology

## 2019-08-26 ENCOUNTER — Ambulatory Visit: Payer: Self-pay | Admitting: Surgery

## 2019-08-26 ENCOUNTER — Telehealth: Payer: Self-pay | Admitting: Oncology

## 2019-08-26 ENCOUNTER — Other Ambulatory Visit: Payer: Self-pay

## 2019-08-26 VITALS — BP 157/82 | HR 72 | Temp 97.8°F | Resp 18 | Ht 63.0 in | Wt 171.4 lb

## 2019-08-26 DIAGNOSIS — Z170 Estrogen receptor positive status [ER+]: Secondary | ICD-10-CM | POA: Insufficient documentation

## 2019-08-26 DIAGNOSIS — Z79899 Other long term (current) drug therapy: Secondary | ICD-10-CM | POA: Diagnosis not present

## 2019-08-26 DIAGNOSIS — C50312 Malignant neoplasm of lower-inner quadrant of left female breast: Secondary | ICD-10-CM | POA: Diagnosis not present

## 2019-08-26 DIAGNOSIS — M129 Arthropathy, unspecified: Secondary | ICD-10-CM | POA: Insufficient documentation

## 2019-08-26 DIAGNOSIS — Z7982 Long term (current) use of aspirin: Secondary | ICD-10-CM | POA: Insufficient documentation

## 2019-08-26 DIAGNOSIS — M19012 Primary osteoarthritis, left shoulder: Secondary | ICD-10-CM | POA: Insufficient documentation

## 2019-08-26 DIAGNOSIS — K219 Gastro-esophageal reflux disease without esophagitis: Secondary | ICD-10-CM | POA: Insufficient documentation

## 2019-08-26 DIAGNOSIS — E119 Type 2 diabetes mellitus without complications: Secondary | ICD-10-CM | POA: Insufficient documentation

## 2019-08-26 DIAGNOSIS — I10 Essential (primary) hypertension: Secondary | ICD-10-CM | POA: Diagnosis not present

## 2019-08-26 DIAGNOSIS — E7800 Pure hypercholesterolemia, unspecified: Secondary | ICD-10-CM | POA: Insufficient documentation

## 2019-08-26 NOTE — Telephone Encounter (Signed)
I talk with patient regarding schedule  

## 2019-08-26 NOTE — Patient Instructions (Signed)
Coronavirus (COVID-19) Are you at risk?  Are you at risk for the Coronavirus (COVID-19)?  To be considered HIGH RISK for Coronavirus (COVID-19), you have to meet the following criteria:  . Traveled to [place], [place], [place], [place] or [place]; or in the [place] to [place], [place], [place], or [place]; and have fever, cough, and shortness of breath within the last 2 weeks of travel OR . Been in close contact with a person diagnosed with COVID-19 within the last 2 weeks and have fever, cough, and shortness of breath . IF YOU DO NOT MEET THESE CRITERIA, YOU ARE CONSIDERED LOW RISK FOR COVID-19.  What to do if you are HIGH RISK for COVID-19?  . If you are having a medical emergency, call 911. . Seek medical care right away. Before you go to a doctor's office, urgent care or emergency department, call ahead and tell them about your recent travel, contact with someone diagnosed with COVID-19, and your symptoms. You should receive instructions from your physician's office regarding next steps of care.  . When you arrive at healthcare provider, tell the healthcare staff immediately you have returned from visiting [place], [place], [place], [place] or [place]; or traveled in the [place] to [place], [place], [place], or [place]; in the last two weeks or you have been in close contact with a person diagnosed with COVID-19 in the last 2 weeks.   . Tell the health care staff about your symptoms: fever, cough and shortness of breath. . After you have been seen by a medical provider, you will be either: o Tested for (COVID-19) and discharged home on quarantine except to seek medical care if symptoms worsen, and asked to  - Stay home and avoid contact with others until you get your results (4-5 days)  - Avoid travel on public transportation if possible (such as bus, train, or airplane) or o Sent to the Emergency Department by EMS for evaluation, COVID-19 testing, and possible  admission depending on your condition and test results.  What to do if you are LOW RISK for COVID-19?  Reduce your risk of any infection by using the same precautions used for avoiding the common cold or flu:  . Wash your hands often with soap and warm water for at least 20 seconds.  If soap and water are not readily available, use an alcohol-based hand sanitizer with at least 60% alcohol.  . If coughing or sneezing, cover your mouth and nose by coughing or sneezing into the elbow areas of your shirt or coat, into a tissue or into your sleeve (not your hands). . Avoid shaking hands with others and consider head nods or verbal greetings only. . Avoid touching your eyes, nose, or mouth with unwashed hands.  . Avoid close contact with people who are sick. . Avoid places or events with large numbers of people in one location, like concerts or sporting events. . Carefully consider travel plans you have or are making. . If you are planning any travel outside or inside the [place], visit the CDC's Travelers' Health webpage for the latest health notices. . If you have some symptoms but not all symptoms, continue to monitor at home and seek medical attention if your symptoms worsen. . If you are having a medical emergency, call 911.   ADDITIONAL HEALTHCARE OPTIONS FOR PATIENTS  [facility] / e-Visit: [email or web address]         [facility]: [phone]  [facility]: [phone]                   [facility]: [phone]   

## 2019-08-26 NOTE — Progress Notes (Signed)
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 She denied palpating any masses. She underwent bilateral diagnostic mammography with tomography and left breast ultrasonography showing: breast density category B; suspicious 0.9 cm mass at 6-7 o'clock in the left breast; 3 mm benign cyst in the outer retroareolar left breast at 3 o'clock; left axilla negative for lymphadenopathy.  Accordingly on 08/06/2019 she proceeded to biopsy of the left breast area in question. The pathology from this procedure ([id number]) showed: invasive mammary carcinoma, grade 2, e-cadherin negative. Prognostic indicators significant for: estrogen receptor, 95% positive and progesterone receptor, 30% positive, both with strong staining intensity. Proliferation marker Ki67 at 5%. HER2 equivocal by immunohistochemistry (2+), but negative by fluorescent in situ hybridization with a signals ratio 1.4 and number per cell 1.75.  She underwent breast MRI yesterday, 08/24/2019, which showed: No suspicious masses in the right breast no abnormal appearing lymph nodes complex mass in the lower outer left breast which is postbiopsy change and hematoma primarily.  She has an appointment with Dr. Sondra Come 08/26/2019.  She is scheduled for left lumpectomy on 09/08/2019 under Dr. Brantley Stage.  Past/Anticipated interventions by surgeon, if any: 09/08/19: LEFT BREAST LUMPECTOMY WITH RADIOACTIVE SEED AND LEFT SENTINEL LYMPH NODE MAPPING Cornett, Marcello Moores, MD  Past/Anticipated interventions by medical  oncology, if any: Chemotherapy Per Dr. Jana Hakim 08/25/19: The plan then is to proceed to surgery.  The patient will discuss the radiation option with Dr. Randa Ngo later this week.  I will see her again in early April and at that time we will discuss antiestrogens in detail.   Lymphedema issues, if any: Pt has not had surgery at this time.  Pain issues, if any: Pt reports bilateral shoulder pain, rated a 8/10. Pt with limited ROM to shoulders.  SAFETY ISSUES:  Prior radiation? no  Pacemaker/ICD? no  Possible current pregnancy?no  Is the patient on methotrexate? no  Current Complaints / other details: Pt presents today for initial consult with Dr. Sondra Come for Radiation Oncology. Pt is accompanied by husband, [name].   BP (!) 157/82 (BP Location: Right Arm, Patient Position: Sitting)   Pulse 72   Temp 97.8 F (36.6 C) (Temporal)   Resp 18   Ht '5\' 3"'  (1.6 m)   Wt 171 lb 6 oz (77.7 kg)   SpO2 100%   BMI 30.36 kg/m   Wt Readings from Last 3 Encounters:  08/26/19 171 lb 6 oz (77.7 kg)  08/25/19 173 lb 9.6 oz (78.7 kg)  11/26/17 165 lb (74.8 kg)       Loma Sousa, RN 08/26/2019,11:03 AM

## 2019-09-01 ENCOUNTER — Encounter (HOSPITAL_BASED_OUTPATIENT_CLINIC_OR_DEPARTMENT_OTHER): Payer: Self-pay | Admitting: Surgery

## 2019-09-01 ENCOUNTER — Other Ambulatory Visit: Payer: Self-pay

## 2019-09-02 ENCOUNTER — Encounter (HOSPITAL_BASED_OUTPATIENT_CLINIC_OR_DEPARTMENT_OTHER)
Admission: RE | Admit: 2019-09-02 | Discharge: 2019-09-02 | Disposition: A | Discharge status: Home / Self Care | Payer: Medicare Other | Source: Ambulatory Visit | Attending: Surgery | Admitting: Surgery

## 2019-09-02 DIAGNOSIS — I10 Essential (primary) hypertension: Secondary | ICD-10-CM | POA: Diagnosis not present

## 2019-09-02 DIAGNOSIS — R9431 Abnormal electrocardiogram [ECG] [EKG]: Secondary | ICD-10-CM | POA: Diagnosis not present

## 2019-09-02 NOTE — Progress Notes (Signed)

## 2019-09-04 ENCOUNTER — Other Ambulatory Visit (HOSPITAL_COMMUNITY)
Admission: RE | Admit: 2019-09-04 | Discharge: 2019-09-04 | Disposition: A | Discharge status: Home / Self Care | Payer: Medicare Other | Source: Ambulatory Visit | Attending: Surgery | Admitting: Surgery

## 2019-09-04 DIAGNOSIS — Z01812 Encounter for preprocedural laboratory examination: Secondary | ICD-10-CM | POA: Insufficient documentation

## 2019-09-04 DIAGNOSIS — Z20822 Contact with and (suspected) exposure to covid-19: Secondary | ICD-10-CM | POA: Diagnosis not present

## 2019-09-04 LAB — SARS CORONAVIRUS 2 (TAT 6-24 HRS): SARS Coronavirus 2: NEGATIVE

## 2019-09-07 ENCOUNTER — Other Ambulatory Visit: Payer: Self-pay

## 2019-09-07 ENCOUNTER — Ambulatory Visit
Admission: RE | Admit: 2019-09-07 | Discharge: 2019-09-07 | Disposition: A | Discharge status: Home / Self Care | Payer: Medicare Other | Source: Ambulatory Visit | Attending: Surgery | Admitting: Surgery

## 2019-09-07 DIAGNOSIS — C50912 Malignant neoplasm of unspecified site of left female breast: Secondary | ICD-10-CM | POA: Diagnosis not present

## 2019-09-07 DIAGNOSIS — Z170 Estrogen receptor positive status [ER+]: Secondary | ICD-10-CM

## 2019-09-07 DIAGNOSIS — C50012 Malignant neoplasm of nipple and areola, left female breast: Secondary | ICD-10-CM

## 2019-09-07 NOTE — Anesthesia Preprocedure Evaluation (Addendum)
Anesthesia Evaluation  Patient identified by MRN, date of birth, ID band Patient awake    Reviewed: Allergy & Precautions, NPO status , Patient's Chart, lab work & pertinent test results  History of Anesthesia Complications Negative for: history of anesthetic complications  Airway Mallampati: II  TM Distance: >3 FB Neck ROM: Full    Dental no notable dental hx.    Pulmonary neg pulmonary ROS,    Pulmonary exam normal        Cardiovascular hypertension, Pt. on medications Normal cardiovascular exam     Neuro/Psych negative neurological ROS  negative psych ROS   GI/Hepatic Neg liver ROS, GERD  Medicated and Controlled,  Endo/Other  diabetes, Type 2  Renal/GU negative Renal ROS  negative genitourinary   Musculoskeletal  (+) Arthritis ,   Abdominal   Peds  Hematology negative hematology ROS (+)   Anesthesia Other Findings Left breast ca  Reproductive/Obstetrics negative OB ROS                            Anesthesia Physical Anesthesia Plan  ASA: II  Anesthesia Plan: General   Post-op Pain Management: GA combined w/ Regional for post-op pain   Induction: Intravenous  PONV Risk Score and Plan: 4 or greater and Treatment may vary due to age or medical condition, Ondansetron, Dexamethasone and Midazolam  Airway Management Planned: LMA  Additional Equipment: None  Intra-op Plan:   Post-operative Plan: Extubation in OR  Informed Consent: I have reviewed the patients History and Physical, chart, labs and discussed the procedure including the risks, benefits and alternatives for the proposed anesthesia with the patient or authorized representative who has indicated his/her understanding and acceptance.     Dental advisory given  Plan Discussed with: CRNA  Anesthesia Plan Comments:        Anesthesia Quick Evaluation

## 2019-09-08 ENCOUNTER — Ambulatory Visit
Admission: RE | Admit: 2019-09-08 | Discharge: 2019-09-08 | Disposition: A | Discharge status: Home / Self Care | Payer: Medicare Other | Source: Ambulatory Visit | Attending: Surgery | Admitting: Surgery

## 2019-09-08 ENCOUNTER — Ambulatory Visit (HOSPITAL_BASED_OUTPATIENT_CLINIC_OR_DEPARTMENT_OTHER): Payer: Medicare Other | Admitting: Anesthesiology

## 2019-09-08 ENCOUNTER — Other Ambulatory Visit: Payer: Self-pay

## 2019-09-08 ENCOUNTER — Encounter (HOSPITAL_BASED_OUTPATIENT_CLINIC_OR_DEPARTMENT_OTHER): Payer: Self-pay | Admitting: Surgery

## 2019-09-08 ENCOUNTER — Ambulatory Visit (HOSPITAL_COMMUNITY): Payer: Medicare Other

## 2019-09-08 ENCOUNTER — Ambulatory Visit (HOSPITAL_BASED_OUTPATIENT_CLINIC_OR_DEPARTMENT_OTHER)
Admission: RE | Admit: 2019-09-08 | Discharge: 2019-09-08 | Disposition: A | Discharge status: Home / Self Care | Payer: Medicare Other | Attending: Surgery | Admitting: Surgery

## 2019-09-08 ENCOUNTER — Encounter (HOSPITAL_BASED_OUTPATIENT_CLINIC_OR_DEPARTMENT_OTHER)
Admission: RE | Disposition: A | Discharge status: Home / Self Care | Payer: Self-pay | Source: Home / Self Care | Attending: Surgery

## 2019-09-08 ENCOUNTER — Encounter (HOSPITAL_COMMUNITY): Payer: Medicare Other

## 2019-09-08 DIAGNOSIS — M1990 Unspecified osteoarthritis, unspecified site: Secondary | ICD-10-CM | POA: Insufficient documentation

## 2019-09-08 DIAGNOSIS — C50012 Malignant neoplasm of nipple and areola, left female breast: Secondary | ICD-10-CM

## 2019-09-08 DIAGNOSIS — K219 Gastro-esophageal reflux disease without esophagitis: Secondary | ICD-10-CM | POA: Diagnosis not present

## 2019-09-08 DIAGNOSIS — Z79899 Other long term (current) drug therapy: Secondary | ICD-10-CM | POA: Insufficient documentation

## 2019-09-08 DIAGNOSIS — E119 Type 2 diabetes mellitus without complications: Secondary | ICD-10-CM | POA: Diagnosis not present

## 2019-09-08 DIAGNOSIS — I10 Essential (primary) hypertension: Secondary | ICD-10-CM | POA: Insufficient documentation

## 2019-09-08 DIAGNOSIS — C50912 Malignant neoplasm of unspecified site of left female breast: Secondary | ICD-10-CM | POA: Diagnosis not present

## 2019-09-08 DIAGNOSIS — Z8249 Family history of ischemic heart disease and other diseases of the circulatory system: Secondary | ICD-10-CM | POA: Insufficient documentation

## 2019-09-08 DIAGNOSIS — Z170 Estrogen receptor positive status [ER+]: Secondary | ICD-10-CM | POA: Diagnosis not present

## 2019-09-08 DIAGNOSIS — E7800 Pure hypercholesterolemia, unspecified: Secondary | ICD-10-CM | POA: Insufficient documentation

## 2019-09-08 DIAGNOSIS — C50312 Malignant neoplasm of lower-inner quadrant of left female breast: Secondary | ICD-10-CM | POA: Diagnosis not present

## 2019-09-08 HISTORY — DX: Other specified disorders of bladder: N32.89

## 2019-09-08 HISTORY — PX: BREAST LUMPECTOMY WITH RADIOACTIVE SEED LOCALIZATION: SHX6424

## 2019-09-08 SURGERY — BREAST LUMPECTOMY WITH RADIOACTIVE SEED LOCALIZATION
Anesthesia: General | Site: Breast | Laterality: Left

## 2019-09-08 MED ORDER — PROPOFOL 10 MG/ML IV BOLUS
INTRAVENOUS | Status: DC | PRN
  Administered 2019-09-08: 140 mg via INTRAVENOUS

## 2019-09-08 MED ORDER — ONDANSETRON HCL 4 MG/2ML IJ SOLN
INTRAMUSCULAR | Status: DC | PRN
  Administered 2019-09-08: 4 mg via INTRAVENOUS

## 2019-09-08 MED ORDER — PROPOFOL 10 MG/ML IV BOLUS
INTRAVENOUS | Status: AC
  Filled 2019-09-08: qty 20

## 2019-09-08 MED ORDER — SODIUM CHLORIDE (PF) 0.9 % IJ SOLN
INTRAMUSCULAR | Status: AC
  Filled 2019-09-08: qty 10

## 2019-09-08 MED ORDER — CEFAZOLIN SODIUM-DEXTROSE 2-4 GM/100ML-% IV SOLN
INTRAVENOUS | Status: AC
  Filled 2019-09-08: qty 100

## 2019-09-08 MED ORDER — FENTANYL CITRATE (PF) 100 MCG/2ML IJ SOLN
INTRAMUSCULAR | Status: AC
  Filled 2019-09-08: qty 2

## 2019-09-08 MED ORDER — MIDAZOLAM HCL 2 MG/2ML IJ SOLN: 1.0000 mg | INTRAMUSCULAR | Status: DC | PRN

## 2019-09-08 MED ORDER — CEFAZOLIN SODIUM-DEXTROSE 2-4 GM/100ML-% IV SOLN
2.0000 g | Freq: Once | INTRAVENOUS | Status: AC
  Administered 2019-09-08: 2 g via INTRAVENOUS

## 2019-09-08 MED ORDER — GABAPENTIN 300 MG PO CAPS: 300.0000 mg | ORAL_CAPSULE | ORAL | Status: DC

## 2019-09-08 MED ORDER — METHYLENE BLUE 0.5 % INJ SOLN
INTRAVENOUS | Status: AC
  Filled 2019-09-08: qty 10

## 2019-09-08 MED ORDER — ACETAMINOPHEN 500 MG PO TABS
ORAL_TABLET | ORAL | Status: AC
  Filled 2019-09-08: qty 2

## 2019-09-08 MED ORDER — LIDOCAINE 2% (20 MG/ML) 5 ML SYRINGE
INTRAMUSCULAR | Status: DC | PRN
  Administered 2019-09-08: 80 mg via INTRAVENOUS

## 2019-09-08 MED ORDER — DEXAMETHASONE SODIUM PHOSPHATE 4 MG/ML IJ SOLN
INTRAMUSCULAR | Status: DC | PRN
  Administered 2019-09-08: 1 mg via INTRAVENOUS

## 2019-09-08 MED ORDER — DEXTROSE 5 % IV SOLN: 3.0000 g | INTRAVENOUS | Status: DC

## 2019-09-08 MED ORDER — SUCCINYLCHOLINE CHLORIDE 20 MG/ML IJ SOLN
INTRAMUSCULAR | Status: DC | PRN
  Administered 2019-09-08: 100 mg via INTRAVENOUS

## 2019-09-08 MED ORDER — BUPIVACAINE HCL (PF) 0.25 % IJ SOLN
INTRAMUSCULAR | Status: DC | PRN
  Administered 2019-09-08: 13 mL

## 2019-09-08 MED ORDER — BUPIVACAINE HCL (PF) 0.25 % IJ SOLN
INTRAMUSCULAR | Status: AC
  Filled 2019-09-08: qty 150

## 2019-09-08 MED ORDER — PROMETHAZINE HCL 25 MG/ML IJ SOLN: 6.2500 mg | INTRAMUSCULAR | Status: DC | PRN

## 2019-09-08 MED ORDER — LIDOCAINE 2% (20 MG/ML) 5 ML SYRINGE
INTRAMUSCULAR | Status: AC
  Filled 2019-09-08: qty 5

## 2019-09-08 MED ORDER — DEXAMETHASONE SODIUM PHOSPHATE 10 MG/ML IJ SOLN
INTRAMUSCULAR | Status: AC
  Filled 2019-09-08: qty 1

## 2019-09-08 MED ORDER — CHLORHEXIDINE GLUCONATE CLOTH 2 % EX PADS: 6.0000 | MEDICATED_PAD | Freq: Once | CUTANEOUS | Status: DC

## 2019-09-08 MED ORDER — MIDAZOLAM HCL 2 MG/2ML IJ SOLN
INTRAMUSCULAR | Status: AC
  Filled 2019-09-08: qty 2

## 2019-09-08 MED ORDER — ONDANSETRON HCL 4 MG/2ML IJ SOLN
INTRAMUSCULAR | Status: AC
  Filled 2019-09-08: qty 2

## 2019-09-08 MED ORDER — FENTANYL CITRATE (PF) 100 MCG/2ML IJ SOLN: 25.0000 ug | INTRAMUSCULAR | Status: DC | PRN

## 2019-09-08 MED ORDER — ACETAMINOPHEN 500 MG PO TABS
1000.0000 mg | ORAL_TABLET | Freq: Once | ORAL | Status: AC
  Administered 2019-09-08: 1000 mg via ORAL

## 2019-09-08 MED ORDER — CELECOXIB 200 MG PO CAPS
ORAL_CAPSULE | ORAL | Status: AC
  Filled 2019-09-08: qty 1

## 2019-09-08 MED ORDER — PROPOFOL 500 MG/50ML IV EMUL
INTRAVENOUS | Status: DC | PRN
  Administered 2019-09-08: 25 ug/kg/min via INTRAVENOUS

## 2019-09-08 MED ORDER — CELECOXIB 200 MG PO CAPS
200.0000 mg | ORAL_CAPSULE | ORAL | Status: AC
  Administered 2019-09-08: 200 mg via ORAL

## 2019-09-08 MED ORDER — LACTATED RINGERS IV SOLN: INTRAVENOUS | Status: DC

## 2019-09-08 MED ORDER — HYDROCODONE-ACETAMINOPHEN 5-325 MG PO TABS: 1.0000 | ORAL_TABLET | Freq: Four times a day (QID) | ORAL | 0 refills | Status: DC | PRN

## 2019-09-08 MED ORDER — FENTANYL CITRATE (PF) 100 MCG/2ML IJ SOLN
INTRAMUSCULAR | Status: DC | PRN
  Administered 2019-09-08: 100 ug via INTRAVENOUS

## 2019-09-08 MED ORDER — ACETAMINOPHEN 500 MG PO TABS: 1000.0000 mg | ORAL_TABLET | ORAL | Status: DC

## 2019-09-08 MED ORDER — FENTANYL CITRATE (PF) 100 MCG/2ML IJ SOLN: 100.0000 ug | INTRAMUSCULAR | Status: DC | PRN

## 2019-09-08 SURGICAL SUPPLY — 38 items
APPLIER CLIP 9.375 MED OPEN (MISCELLANEOUS) ×3
BINDER BREAST XLRG (GAUZE/BANDAGES/DRESSINGS) ×3 IMPLANT
BLADE SURG 15 STRL LF DISP TIS (BLADE) ×1 IMPLANT
BLADE SURG 15 STRL SS (BLADE) ×3
CHLORAPREP W/TINT 26 (MISCELLANEOUS) ×3 IMPLANT
CLIP APPLIE 9.375 MED OPEN (MISCELLANEOUS) ×1 IMPLANT
COVER BACK TABLE 60X90IN (DRAPES) ×3 IMPLANT
COVER MAYO STAND STRL (DRAPES) ×3 IMPLANT
COVER PROBE W GEL 5X96 (DRAPES) ×3 IMPLANT
DERMABOND ADVANCED (GAUZE/BANDAGES/DRESSINGS) ×2
DERMABOND ADVANCED .7 DNX12 (GAUZE/BANDAGES/DRESSINGS) ×1 IMPLANT
DRAPE LAPAROTOMY 100X72 PEDS (DRAPES) ×3 IMPLANT
DRAPE UTILITY XL STRL (DRAPES) ×3 IMPLANT
ELECT COATED BLADE 2.86 ST (ELECTRODE) ×3 IMPLANT
ELECT REM PT RETURN 9FT ADLT (ELECTROSURGICAL) ×3
ELECTRODE REM PT RTRN 9FT ADLT (ELECTROSURGICAL) ×1 IMPLANT
GLOVE BIO SURGEON STRL SZ 6.5 (GLOVE) ×2 IMPLANT
GLOVE BIO SURGEONS STRL SZ 6.5 (GLOVE) ×1
GLOVE BIOGEL PI IND STRL 7.0 (GLOVE) ×1 IMPLANT
GLOVE BIOGEL PI IND STRL 8 (GLOVE) ×1 IMPLANT
GLOVE BIOGEL PI INDICATOR 7.0 (GLOVE) ×2
GLOVE BIOGEL PI INDICATOR 8 (GLOVE) ×2
GLOVE ECLIPSE 8.0 STRL XLNG CF (GLOVE) ×3 IMPLANT
GLOVE SURG SS PI 7.0 STRL IVOR (GLOVE) ×3 IMPLANT
GOWN STRL REUS W/ TWL LRG LVL3 (GOWN DISPOSABLE) ×2 IMPLANT
GOWN STRL REUS W/TWL LRG LVL3 (GOWN DISPOSABLE) ×6
KIT MARKER MARGIN INK (KITS) ×3 IMPLANT
NEEDLE HYPO 25X1 1.5 SAFETY (NEEDLE) ×3 IMPLANT
NS IRRIG 1000ML POUR BTL (IV SOLUTION) IMPLANT
PACK BASIN DAY SURGERY FS (CUSTOM PROCEDURE TRAY) ×3 IMPLANT
PENCIL SMOKE EVACUATOR (MISCELLANEOUS) ×3 IMPLANT
SLEEVE SCD COMPRESS KNEE MED (MISCELLANEOUS) ×3 IMPLANT
SPONGE LAP 4X18 RFD (DISPOSABLE) ×3 IMPLANT
SUT MNCRL AB 4-0 PS2 18 (SUTURE) ×3 IMPLANT
SUT VICRYL 3-0 CR8 SH (SUTURE) ×3 IMPLANT
SYR CONTROL 10ML LL (SYRINGE) ×3 IMPLANT
TOWEL GREEN STERILE FF (TOWEL DISPOSABLE) ×3 IMPLANT
TRAY FAXITRON CT DISP (TRAY / TRAY PROCEDURE) ×3 IMPLANT

## 2019-09-08 NOTE — Transfer of Care (Signed)
Immediate Anesthesia Transfer of Care Note  Patient: Jacqueline Stephenson  Procedure(s) Performed: LEFT BREAST LUMPECTOMY WITH RADIOACTIVE SEED LOCALIZATION (Left Breast)  Patient Location: PACU  Anesthesia Type:General  Level of Consciousness: awake and patient cooperative  Airway & Oxygen Therapy: Patient Spontanous Breathing and Patient connected to face mask oxygen  Post-op Assessment: Report given to RN and Post -op Vital signs reviewed and stable  Post vital signs: Reviewed and stable  Last Vitals:  Vitals Value Taken Time  BP    Temp    Pulse 76 09/08/19 0820  Resp 20 09/08/19 0820  SpO2 95 % 09/08/19 0820  Vitals shown include unvalidated device data.  Last Pain:  Vitals:   09/08/19 0648  TempSrc: Oral  PainSc: 0-No pain      Patients Stated Pain Goal: 6 ([id number])  Complications: No apparent anesthesia complications

## 2019-09-08 NOTE — Interval H&P Note (Signed)
History and Physical Interval Note:  09/08/2019 7:14 AM  Jacqueline Stephenson  has presented today for surgery, with the diagnosis of LEFT BREAST CANCER.  The various methods of treatment have been discussed with the patient and family. After consideration of risks, benefits and other options for treatment, the patient has consented to  Procedure(s) with comments: LEFT BREAST LUMPECTOMY WITH RADIOACTIVE SEED LOCALIZATION (Left) - GENERAL WITH PICK BLOCK as a surgical intervention.  The patient's history has been reviewed, patient examined, no change in status, stable for surgery.  I have reviewed the patient's chart and labs.  Questions were answered to the patient's satisfaction.   Will not need SLN per discussion with medical and radiation oncology since this will not change treatment or recommendations post op.   [facility] [place]

## 2019-09-08 NOTE — Anesthesia Procedure Notes (Signed)
Procedure Name: Intubation Date/Time: 09/08/2019 7:34 AM Performed by: Marrianne Mood, CRNA Pre-anesthesia Checklist: Patient identified, Emergency Drugs available, Suction available, Patient being monitored and Timeout performed Patient Re-evaluated:Patient Re-evaluated prior to induction Oxygen Delivery Method: Circle system utilized Preoxygenation: Pre-oxygenation with 100% oxygen Induction Type: IV induction Ventilation: Mask ventilation without difficulty Laryngoscope Size: Miller and 3 Grade View: Grade II Tube type: Oral Tube size: 7.0 mm Number of attempts: 1 Airway Equipment and Method: Stylet and Oral airway Placement Confirmation: ETT inserted through vocal cords under direct vision,  positive ETCO2 and breath sounds checked- equal and bilateral Secured at: 21 cm Tube secured with: Tape Dental Injury: Teeth and Oropharynx as per pre-operative assessment

## 2019-09-08 NOTE — Discharge Instructions (Signed)
Post Anesthesia Home Care Instructions  Activity: Get plenty of rest for the remainder of the day. A responsible individual must stay with you for 24 hours following the procedure.  For the next 24 hours, DO NOT: -Drive a car -Paediatric nurse -Drink alcoholic beverages -Take any medication unless instructed by your physician -Make any legal decisions or sign important papers.  Meals: Start with liquid foods such as gelatin or soup. Progress to regular foods as tolerated. Avoid greasy, spicy, heavy foods. If nausea and/or vomiting occur, drink only clear liquids until the nausea and/or vomiting subsides. Call your physician if vomiting continues.  Special Instructions/Symptoms: Your throat may feel dry or sore from the anesthesia or the breathing tube placed in your throat during surgery. If this causes discomfort, gargle with warm salt water. The discomfort should disappear within 24 hours.  If you had a scopolamine patch placed behind your ear for the management of post- operative nausea and/or vomiting:  1. The medication in the patch is effective for 72 hours, after which it should be removed.  Wrap patch in a tissue and discard in the trash. Wash hands thoroughly with soap and water. 2. You may remove the patch earlier than 72 hours if you experience unpleasant side effects which may include dry mouth, dizziness or visual disturbances. 3. Avoid touching the patch. Wash your hands with soap and water after contact with the patch.       [place] Office Phone Number [phone]  BREAST BIOPSY/ PARTIAL MASTECTOMY: POST OP INSTRUCTIONS  Always review your discharge instruction sheet given to you by the facility where your surgery was performed.  IF YOU HAVE DISABILITY OR FAMILY LEAVE FORMS, YOU MUST BRING THEM TO THE OFFICE FOR PROCESSING.  DO NOT GIVE THEM TO YOUR DOCTOR.  1. A prescription for pain medication may be given to you upon discharge.  Take your  pain medication as prescribed, if needed.  If narcotic pain medicine is not needed, then you may take acetaminophen (Tylenol) or ibuprofen (Advil) as needed.  NEXT DOSE DUE 1 PM 2. Take your usually prescribed medications unless otherwise directed 3. If you need a refill on your pain medication, please contact your pharmacy.  They will contact our office to request authorization.  Prescriptions will not be filled after 5pm or on week-ends. 4. You should eat very light the first 24 hours after surgery, such as soup, crackers, pudding, etc.  Resume your normal diet the day after surgery. 5. Most patients will experience some swelling and bruising in the breast.  Ice packs and a good support bra will help.  Swelling and bruising can take several days to resolve.  6. It is common to experience some constipation if taking pain medication after surgery.  Increasing fluid intake and taking a stool softener will usually help or prevent this problem from occurring.  A mild laxative (Milk of Magnesia or Miralax) should be taken according to package directions if there are no bowel movements after 48 hours. 7. Unless discharge instructions indicate otherwise, you may remove your bandages 24-48 hours after surgery, and you may shower at that time.  You may have steri-strips (small skin tapes) in place directly over the incision.  These strips should be left on the skin for 7-10 days.  If your surgeon used skin glue on the incision, you may shower in 24 hours.  The glue will flake off over the next 2-3 weeks.  Any sutures or staples will be removed at  the office during your follow-up visit. 8. ACTIVITIES:  You may resume regular daily activities (gradually increasing) beginning the next day.  Wearing a good support bra or sports bra minimizes pain and swelling.  You may have sexual intercourse when it is comfortable. a. You may drive when you no longer are taking prescription pain medication, you can comfortably wear a  seatbelt, and you can safely maneuver your car and apply brakes. b. RETURN TO WORK:  ______________________________________________________________________________________ 9. You should see your doctor in the office for a follow-up appointment approximately two weeks after your surgery.  Your doctor's nurse will typically make your follow-up appointment when she calls you with your pathology report.  Expect your pathology report 2-3 business days after your surgery.  You may call to check if you do not hear from [place] after three days. 10. OTHER INSTRUCTIONS: _______________________________________________________________________________________________ _____________________________________________________________________________________________________________________________________ _____________________________________________________________________________________________________________________________________ _____________________________________________________________________________________________________________________________________  WHEN TO CALL YOUR DOCTOR: 1. Fever over 101.0 2. Nausea and/or vomiting. 3. Extreme swelling or bruising. 4. Continued bleeding from incision. 5. Increased pain, redness, or drainage from the incision.  The clinic staff is available to answer your questions during regular business hours.  Please don't hesitate to call and ask to speak to one of the nurses for clinical concerns.  If you have a medical emergency, go to the nearest emergency room or call 911.  A surgeon from [facility] is always on call at the hospital.  For further questions, please visit [email or web address]

## 2019-09-08 NOTE — Op Note (Signed)
Preoperative diagnosis: Stage I left breast cancer lower inner quadrant  Postoperative diagnosis: Same  Procedure: Left breast seed localized lumpectomy  Surgeon: Erroll Luna, MD  Anesthesia: General with local consisting of 0.25% Sensorcaine  EBL: Minimal  Specimen: Left breast tissue with seed and clip verified by Faxitron and additional medial margin sent separately  Drains: None  IV fluids: Per anesthesia record  Indications for procedure: Patient is a 79 year old female with stage I left breast cancer.  This was a small lesion and given its size, grade and location this was felt to be treatable with local therapy followed by antiestrogen therapy.  She would not need radiation therapy and therefore a sentinel lymph node was not requested.  We discussed the pros and cons and reviewed the literature pertaining to this and she met criteria for omission of sentinel lymph node biopsy.  She has significant shoulder problems and loss of range of motion therefore this would been very difficult for her as well.  She understood this would not change her prognosis.The procedure has been discussed with the patient. Alternatives to surgery have been discussed with the patient.  Risks of surgery include bleeding,  Infection,  Seroma formation, death,  and the need for further surgery.   The patient understands and wishes to proceed.  Description of procedure: The patient was met in the holding area and questions were answered.  Neoprobe used to verify seed location in the left lower inner quadrant.  Films were available for review as well.  She was taken back to the operating room.  She is placed supine upon the OR table.  After induction of general anesthesia, left breast was prepped and draped in sterile fashion timeout performed.  Neoprobe was used to verify seed location left lower inner quadrant.  Incision was made after injection of local anesthetic over the left lower inner part of the breast.   Dissection was carried down all tissue around the seed and clip were excised with a grossly negative margin.  Faxitron image was reviewed by radiology and myself.  Both seed and clip were present but I felt the medial margin was close therefore took additional medial margin.  Hemostasis achieved.  Cavity was clipped.  He was then closed after assuring hemostasis with 3-0 Vicryl and 4-0 Monocryl.  Dermabond applied.  All final counts found to be correct.  The specimen was sent to pathology.  The patient was awoke extubated taken recovery in satisfactory condition.

## 2019-09-08 NOTE — Progress Notes (Signed)
Order for patients consent indicated patient would get sentinel lymph node mapping. Patient stated she wasn't aware she was getting lymph nodes mapped or biopsied. Verified with Dr. Brantley Stage and patient will only have left breast seed lumpectomy. Updated consent order with verbal order.

## 2019-09-09 ENCOUNTER — Encounter: Payer: Self-pay | Admitting: *Deleted

## 2019-09-09 NOTE — Anesthesia Postprocedure Evaluation (Signed)
Anesthesia Post Note  Patient: Jacqueline Stephenson  Procedure(s) Performed: LEFT BREAST LUMPECTOMY WITH RADIOACTIVE SEED LOCALIZATION (Left Breast)     Patient location during evaluation: PACU Anesthesia Type: General Level of consciousness: awake and alert and oriented Pain management: pain level controlled Vital Signs Assessment: post-procedure vital signs reviewed and stable Respiratory status: spontaneous breathing, nonlabored ventilation and respiratory function stable Cardiovascular status: blood pressure returned to baseline Postop Assessment: no apparent nausea or vomiting Anesthetic complications: no    Last Vitals:  Vitals:   09/08/19 1015 09/08/19 1040  BP: (!) 151/76 (!) 151/76  Pulse: 74 74  Resp: 16 16  Temp: 36.6 C 36.6 C  SpO2: 94% 94%    Last Pain:  Vitals:   09/09/19 1007  TempSrc:   PainSc: 2    Pain Goal: Patients Stated Pain Goal: 6 (09/08/19 0648)                 Brennan Bailey

## 2019-09-10 LAB — SURGICAL PATHOLOGY

## 2019-09-11 ENCOUNTER — Encounter: Payer: Self-pay | Admitting: *Deleted

## 2019-10-07 ENCOUNTER — Ambulatory Visit: Payer: Medicare Other | Admitting: Oncology

## 2019-10-07 DIAGNOSIS — C50919 Malignant neoplasm of unspecified site of unspecified female breast: Secondary | ICD-10-CM | POA: Diagnosis not present

## 2019-10-07 DIAGNOSIS — M899 Disorder of bone, unspecified: Secondary | ICD-10-CM | POA: Diagnosis not present

## 2019-10-07 DIAGNOSIS — E1169 Type 2 diabetes mellitus with other specified complication: Secondary | ICD-10-CM | POA: Diagnosis not present

## 2019-10-07 DIAGNOSIS — E7800 Pure hypercholesterolemia, unspecified: Secondary | ICD-10-CM | POA: Diagnosis not present

## 2019-10-07 DIAGNOSIS — E559 Vitamin D deficiency, unspecified: Secondary | ICD-10-CM | POA: Diagnosis not present

## 2019-10-07 DIAGNOSIS — I10 Essential (primary) hypertension: Secondary | ICD-10-CM | POA: Diagnosis not present

## 2019-10-07 NOTE — Progress Notes (Signed)
[place]  [phone] [phone]     ID: Jacqueline Stephenson DOB: 02-02-1941  MR#: [id number]  KGY#:[id number]  Patient Care Team: Gaynelle Arabian, MD as PCP - General (Family Medicine) Erroll Luna, MD as Consulting Physician (General Surgery) Angila Wombles, Virgie Dad, MD as Consulting Physician (Oncology) Marylynn Pearson, MD as Consulting Physician (Obstetrics and Gynecology) Gery Pray, MD as Consulting Physician (Radiation Oncology) Mauro Kaufmann, RN as Oncology Nurse Navigator Rockwell Germany, RN as Oncology Nurse Navigator Chauncey Cruel, MD OTHER MD:  CHIEF COMPLAINT: Estrogen receptor positive lobular breast cancer  CURRENT TREATMENT: Awaiting adjuvant radiation   INTERVAL HISTORY: Jacqueline Stephenson returns today for follow up of her estrogen receptor positive lobular breast cancer accompanied by her husband [name]. She was evaluated in the breast cancer clinic on 08/25/2019.  Since consultation, she underwent left lumpectomy on 09/08/2019 under Dr. Brantley Stage. Pathology from the procedure [id number]) showed: invasive lobular carcinoma, grade 1, 1.5 cm; lobular neoplasia; margins uninvolved.  Her case was presented at the multidisciplinary breast cancer conference 09/23/2019.  At that point it was felt that since she had no sentinel lymph node sampling and also had a close margin she would benefit from radiation.  This would be followed by antiestrogens.   REVIEW OF SYSTEMS: Jacqueline Stephenson did well with her surgery, with no significant pain bleeding or fever.  She only took pain medicines for 2 days.  She is having more trouble with her shoulders.  This has been an ongoing issue.  She was seen by Ortho, received some cortisone treatments, but had no relief and is interested in getting a second opinion actually from another doctor in the same group.  She is pleased with the way things are going so far and a detailed review of systems was otherwise  noncontributory   HISTORY OF CURRENT ILLNESS: From the original intake note:  Jacqueline Stephenson presented for her annual mammogram at [facility] on 07/30/2019 with left nipple inversion. She denied palpating any masses. She underwent bilateral diagnostic mammography with tomography and left breast ultrasonography showing: breast density category B; suspicious 0.9 cm mass at 6-7 o'clock in the left breast; 3 mm benign cyst in the outer retroareolar left breast at 3 o'clock; left axilla negative for lymphadenopathy.  Accordingly on 08/06/2019 she proceeded to biopsy of the left breast area in question. The pathology from this procedure ([id number]) showed: invasive mammary carcinoma, grade 2, e-cadherin negative. Prognostic indicators significant for: estrogen receptor, 95% positive and progesterone receptor, 30% positive, both with strong staining intensity. Proliferation marker Ki67 at 5%. HER2 equivocal by immunohistochemistry (2+), but negative by fluorescent in situ hybridization with a signals ratio 1.4 and number per cell 1.75.  She underwent breast MRI yesterday, 08/24/2019, which showed: No suspicious masses in the right breast no abnormal appearing lymph nodes complex mass in the lower outer left breast which is postbiopsy change and hematoma primarily.  She has an appointment with Dr. Sondra Come 08/26/2019.  She is scheduled for left lumpectomy on 09/08/2019 under Dr. Brantley Stage.  The patient's subsequent history is as detailed below.   PAST MEDICAL HISTORY: Past Medical History:  Diagnosis Date  . Arthritis   . Bladder spasms   . Diabetes mellitus without complication ([place])   . GERD (gastroesophageal reflux disease)   . High cholesterol   . Hypertension   . Shingles   . Vertigo     PAST SURGICAL HISTORY: Past Surgical History:  Procedure Laterality Date  . BREAST EXCISIONAL BIOPSY    .  BREAST LUMPECTOMY WITH RADIOACTIVE SEED LOCALIZATION Left 09/08/2019   Procedure: LEFT  BREAST LUMPECTOMY WITH RADIOACTIVE SEED LOCALIZATION;  Surgeon: Erroll Luna, MD;  Location: [facility];  Service: General;  Laterality: Left;  . TRIGGER FINGER RELEASE Left     FAMILY HISTORY: Family History  Problem Relation Age of Onset  . Heart disease Mother   . Heart disease Father    Jariya's father died in his late 60s from heart disease.  He was a smoker.  The patient's mother died at age 13 also from heart disease.  She was not a smoker.  There is no breast ovarian pancreatic or prostate cancer in the family to the patient's knowledge.  The patient has no brothers or sisters.  GYNECOLOGIC HISTORY:  No LMP recorded. Patient is postmenopausal. Menarche: 79 years old Age at first live birth: 79 years old [place] P 1 LMP late 26s Contraceptive: Oral contraceptives less than 1 year HRT no  Hysterectomy? no BSO?  No   SOCIAL HISTORY: (updated 08/2019)  Jacqueline Stephenson worked at [employer] and light as did her husband Risk manager.  He is a former [employer] fast streamer.  They are both retired.  They have been married more than 50 years.  Their child Marya Amsler, 64 years old, lives in [place] and works usually for [employer]  Marguerite Olea has a child from an earlier marriage.  The patient has 2 grandchildren.  She attends a local [place] DIRECTIVES: In the absence of any documentation to the contrary, the patient's spouse is their HCPOA.    HEALTH MAINTENANCE: Social History   Tobacco Use  . Smoking status: Never Smoker  . Smokeless tobacco: Never Used  Substance Use Topics  . Alcohol use: Yes    Comment: Socially  . Drug use: Not Currently     Colonoscopy: [facility]  PAP: 2021  Bone density: "Normal" per patient   No Known Allergies  Current Outpatient Medications  Medication Sig Dispense Refill  . aspirin EC 81 MG tablet Take 81 mg by mouth daily.    . calcium carbonate (CALCIUM 600) 600 MG TABS tablet Take 600 mg by mouth daily.    . calcium carbonate  (TUMS - DOSED IN MG ELEMENTAL CALCIUM) 500 MG chewable tablet Chew 2-4 tablets by mouth as needed for indigestion or heartburn.    . cholecalciferol (VITAMIN D) 1000 units tablet Take 2,000 Units by mouth daily.    Marland Kitchen HYDROcodone-acetaminophen (NORCO/VICODIN) 5-325 MG tablet Take 1 tablet by mouth every 6 (six) hours as needed for moderate pain. 15 tablet 0  . lisinopril (PRINIVIL,ZESTRIL) 20 MG tablet Take 20 mg by mouth daily.     . Multiple Vitamin (MULTIVITAMIN WITH MINERALS) TABS tablet Take 1 tablet by mouth daily.    . rosuvastatin (CRESTOR) 10 MG tablet Take 10 mg by mouth every evening.  0  . tolterodine (DETROL LA) 4 MG 24 hr capsule Take 4 mg by mouth daily.    . vitamin E 100 UNIT capsule Take 100 Units by mouth daily.     No current facility-administered medications for this visit.    OBJECTIVE:  white woman in no acute distress  Vitals:   10/08/19 1540  BP: (!) 150/71  Pulse: 83  Resp: 18  Temp: 98.3 F (36.8 C)  SpO2: 97%     Body mass index is 30.72 kg/m.   Wt Readings from Last 3 Encounters:  10/08/19 173 lb 6.4 oz (78.7 kg)  09/08/19 172 lb  2.9 oz (78.1 kg)  08/26/19 171 lb 6 oz (77.7 kg)      ECOG FS:1 - Symptomatic but completely ambulatory  Sclerae unicteric, EOMs intact Wearing a mask No cervical or supraclavicular adenopathy Lungs no rales or rhonchi Heart regular rate and rhythm Abd soft, nontender, positive bowel sounds MSK no focal spinal tenderness, no upper extremity lymphedema Neuro: nonfocal, well oriented, appropriate affect Breasts: The right breast is unremarkable.  The left breast is status post recent lumpectomy.  The incision is inferior and not visible when the patient is sitting up.  It is healing nicely, with no dehiscence erythema or swelling.  Both axillae are benign.   LAB RESULTS:  CMP     Component Value Date/Time   NA 141 08/25/2019 1524   K 4.3 08/25/2019 1524   CL 105 08/25/2019 1524   CO2 29 08/25/2019 1524   GLUCOSE  124 (H) 08/25/2019 1524   BUN 17 08/25/2019 1524   CREATININE 1.08 (H) 08/25/2019 1524   CALCIUM 9.6 08/25/2019 1524   PROT 7.4 08/25/2019 1524   ALBUMIN 3.9 08/25/2019 1524   AST 19 08/25/2019 1524   ALT 18 08/25/2019 1524   ALKPHOS 46 08/25/2019 1524   BILITOT 0.4 08/25/2019 1524   GFRNONAA 49 (L) 08/25/2019 1524   GFRAA 57 (L) 08/25/2019 1524    No results found for: TOTALPROTELP, ALBUMINELP, A1GS, A2GS, BETS, BETA2SER, GAMS, MSPIKE, SPEI  Lab Results  Component Value Date   WBC 6.7 08/25/2019   NEUTROABS 3.7 08/25/2019   HGB 13.9 08/25/2019   HCT 41.5 08/25/2019   MCV 91.0 08/25/2019   PLT 231 08/25/2019    No results found for: LABCA2  No components found for: JKDTOI712  No results for input(s): INR in the last 168 hours.  No results found for: LABCA2  No results found for: WPY099  No results found for: IPJ825  No results found for: KNL976  No results found for: CA2729  No components found for: HGQUANT  No results found for: CEA1 / No results found for: CEA1   No results found for: AFPTUMOR  No results found for: CHROMOGRNA  No results found for: KPAFRELGTCHN, LAMBDASER, KAPLAMBRATIO (kappa/lambda light chains)  No results found for: HGBA, HGBA2QUANT, HGBFQUANT, HGBSQUAN (Hemoglobinopathy evaluation)   No results found for: LDH  No results found for: IRON, TIBC, IRONPCTSAT (Iron and TIBC)  No results found for: FERRITIN  Urinalysis    Component Value Date/Time   COLORURINE YELLOW 12/02/2017 [place] 12/02/2017 1243   LABSPEC 1.014 12/02/2017 1243   PHURINE 6.0 12/02/2017 [place] 12/02/2017 [place] 12/02/2017 1243   BILIRUBINUR NEGATIVE 12/02/2017 [place] 12/02/2017 1243   PROTEINUR NEGATIVE 12/02/2017 1243   NITRITE NEGATIVE 12/02/2017 1243   LEUKOCYTESUR TRACE (A) 12/02/2017 1243     STUDIES: No results found.   ELIGIBLE FOR AVAILABLE RESEARCH PROTOCOL:  AET  ASSESSMENT: 79 y.o. [place] woman status post left breast lower inner quadrant biopsy 08/06/2019 for a clinical T1b N0, stage IA invasive lobular carcinoma, E-cadherin negative, grade 2, estrogen and progesterone receptor positive, HER-2 not amplified, with an MIB-1 of 5%  (1) status post left lumpectomy with no sentinel lymph node sampling 09/08/2019 for a pT1c pNX, stage IA invasive lobular breast cancer, grade 1, with close but negative margins.  (2) adjuvant radiation pending  (3) to start anastrozole December 01, 2019  PLAN:  Tashari did very well with her surgery.  We had originally planned on no radiation treatments if she took antiestrogens, but given the close margin and also the fact that she did not have sentinel lymph node sampling it was felt that conference it might be prudent to proceed with radiation.  We discussed this today and she is agreeable so I have placed the referral to Dr. Sondra Come  We also discussed antiestrogens.  I think she would be a good candidate for anastrozole.  She has a good understanding of the possible toxicities side effects and complications.  We would start this when she finishes radiation to the target start date for anastrozole as of 601 2021.  She is interested in a second opinion regarding her shoulder problems and we discussed that today as well  She will return to see me in August after she has been on anastrozole a couple of months, to assess tolerance.  She will have a repeat mammogram likely late this year.  We will request bone density results from Dr. Andrew Au office but per patient report these have been normal  She knows to call for any other issue that may develop before then  Total encounter time 35 minutes.Chauncey Cruel, MD   10/08/2019 5:37 PM Medical Oncology and Hematology [facility] [place], [place] [address] Tel. [phone]    Fax. [phone]   This document serves as a record of  services personally performed by Lurline Del, MD. It was created on his behalf by Wilburn Mylar, a trained medical scribe. The creation of this record is based on the scribe's personal observations and the provider's statements to them.   I, Lurline Del MD, have reviewed the above documentation for accuracy and completeness, and I agree with the above.   *Total Encounter Time as defined by the Centers for Medicare and Medicaid Services includes, in addition to the face-to-face time of a patient visit (documented in the note above) non-face-to-face time: obtaining and reviewing outside history, ordering and reviewing medications, tests or procedures, care coordination (communications with other health care professionals or caregivers) and documentation in the medical record.

## 2019-10-08 ENCOUNTER — Other Ambulatory Visit: Payer: Self-pay

## 2019-10-08 ENCOUNTER — Inpatient Hospital Stay: Payer: Medicare Other | Attending: Oncology | Admitting: Oncology

## 2019-10-08 ENCOUNTER — Encounter: Payer: Self-pay | Admitting: *Deleted

## 2019-10-08 VITALS — BP 150/71 | HR 83 | Temp 98.3°F | Resp 18 | Ht 63.0 in | Wt 173.4 lb

## 2019-10-08 DIAGNOSIS — Z170 Estrogen receptor positive status [ER+]: Secondary | ICD-10-CM | POA: Diagnosis not present

## 2019-10-08 DIAGNOSIS — Z79811 Long term (current) use of aromatase inhibitors: Secondary | ICD-10-CM | POA: Insufficient documentation

## 2019-10-08 DIAGNOSIS — K219 Gastro-esophageal reflux disease without esophagitis: Secondary | ICD-10-CM | POA: Diagnosis not present

## 2019-10-08 DIAGNOSIS — Z7982 Long term (current) use of aspirin: Secondary | ICD-10-CM | POA: Insufficient documentation

## 2019-10-08 DIAGNOSIS — M129 Arthropathy, unspecified: Secondary | ICD-10-CM | POA: Diagnosis not present

## 2019-10-08 DIAGNOSIS — E119 Type 2 diabetes mellitus without complications: Secondary | ICD-10-CM | POA: Diagnosis not present

## 2019-10-08 DIAGNOSIS — I10 Essential (primary) hypertension: Secondary | ICD-10-CM | POA: Insufficient documentation

## 2019-10-08 DIAGNOSIS — E7800 Pure hypercholesterolemia, unspecified: Secondary | ICD-10-CM | POA: Diagnosis not present

## 2019-10-08 DIAGNOSIS — C50312 Malignant neoplasm of lower-inner quadrant of left female breast: Secondary | ICD-10-CM | POA: Insufficient documentation

## 2019-10-08 DIAGNOSIS — Z79899 Other long term (current) drug therapy: Secondary | ICD-10-CM | POA: Insufficient documentation

## 2019-10-12 ENCOUNTER — Encounter: Payer: Self-pay | Admitting: *Deleted

## 2019-10-14 DIAGNOSIS — L570 Actinic keratosis: Secondary | ICD-10-CM | POA: Diagnosis not present

## 2019-10-14 DIAGNOSIS — L821 Other seborrheic keratosis: Secondary | ICD-10-CM | POA: Diagnosis not present

## 2019-10-14 DIAGNOSIS — L723 Sebaceous cyst: Secondary | ICD-10-CM | POA: Diagnosis not present

## 2019-10-14 DIAGNOSIS — Z85828 Personal history of other malignant neoplasm of skin: Secondary | ICD-10-CM | POA: Diagnosis not present

## 2019-10-19 ENCOUNTER — Other Ambulatory Visit: Payer: Self-pay | Admitting: Oncology

## 2019-10-19 ENCOUNTER — Encounter: Payer: Self-pay | Admitting: Radiation Oncology

## 2019-10-19 ENCOUNTER — Other Ambulatory Visit: Payer: Self-pay

## 2019-10-19 ENCOUNTER — Ambulatory Visit
Admission: RE | Admit: 2019-10-19 | Discharge: 2019-10-19 | Disposition: A | Discharge status: Home / Self Care | Payer: Medicare Other | Source: Ambulatory Visit | Attending: Radiation Oncology | Admitting: Radiation Oncology

## 2019-10-19 ENCOUNTER — Encounter: Payer: Self-pay | Admitting: *Deleted

## 2019-10-19 ENCOUNTER — Encounter: Payer: Self-pay | Admitting: Oncology

## 2019-10-19 VITALS — BP 140/82 | HR 74 | Temp 98.5°F | Resp 20 | Ht 63.0 in | Wt 172.2 lb

## 2019-10-19 DIAGNOSIS — Z170 Estrogen receptor positive status [ER+]: Secondary | ICD-10-CM | POA: Diagnosis not present

## 2019-10-19 DIAGNOSIS — Z79899 Other long term (current) drug therapy: Secondary | ICD-10-CM | POA: Insufficient documentation

## 2019-10-19 DIAGNOSIS — Z79811 Long term (current) use of aromatase inhibitors: Secondary | ICD-10-CM | POA: Insufficient documentation

## 2019-10-19 DIAGNOSIS — G8929 Other chronic pain: Secondary | ICD-10-CM | POA: Diagnosis not present

## 2019-10-19 DIAGNOSIS — C50312 Malignant neoplasm of lower-inner quadrant of left female breast: Secondary | ICD-10-CM

## 2019-10-19 DIAGNOSIS — Z7982 Long term (current) use of aspirin: Secondary | ICD-10-CM | POA: Insufficient documentation

## 2019-10-19 DIAGNOSIS — Z9889 Other specified postprocedural states: Secondary | ICD-10-CM | POA: Diagnosis not present

## 2019-10-19 MED ORDER — ANASTROZOLE 1 MG PO TABS: 1.0000 mg | ORAL_TABLET | Freq: Every day | ORAL | 4 refills | Status: DC

## 2019-10-19 NOTE — Progress Notes (Signed)
Dr. Randa Ngo stopped by to tell me Jacqueline Stephenson has decided against adjuvant radiation.  I called and left her voicemail.  We are starting anastrozole.  I am also referring her to Dr. Onnie Graham for her severe shoulder arthropathy.  I will see her again in 2 months to make sure that she is tolerating the anastrozole well.

## 2019-10-19 NOTE — Progress Notes (Addendum)
Radiation Oncology         [phone]) [phone] ________________________________  Name: Jacqueline Stephenson MRN: [id number]  Date: 10/19/2019  DOB: May 04, 1941  Re-Evaluation Note  CC: Jacqueline Arabian, MD  Magrinat, Virgie Dad, MD    ICD-10-CM   1. Malignant neoplasm of lower-inner quadrant of left breast in female, estrogen receptor positive ([place])  C50.312    Z17.0     Diagnosis: Clinical stage IA (pT1c, pNX) Left Breast LOQ, Invasive Lobular Carcinoma, ER+ / PR+ / Her2-, Grade 1  Narrative: The patient returns today to discuss radiation treatment options. She was seen in consultation on 08/26/2019. At that time, the patient was considered to be a good candidate for lumpectomy for management of her early stage breast cancer. 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 Given her age, tumor characteristics, and apparent early stage, she had appeared to be a patient who could safely avoid adjuvant radiation therapy assuming she was to proceed with adjuvant hormonal therapy as recommended by Dr. Jana Stephenson. At the end of the consultation, the patient felt comfortable proceeding with adjuvant hormonal therapy along after her breast conserving surgery.  The patient underwent left breast lumpectomy on 09/08/2019 performed by Dr. Brantley Stage. Pathology from the procedure showed grade 1 invasive lobular carcinoma spanning 1.5 cm and lobular neoplasia (atypical lobular hyperplasia). Left medial margin was excised and was benign. Invasive carcinoma was 0.1 cm from the superior margin focally.  The patient was seen by Dr. Jana Stephenson on 10/08/2019. Given the close margin and also the fact that she did not have sentinel lymph node sampling, it was recommended that the patient proceed with radiation. They also discussed antiestrogens. The patient is anticipated to begin Anastrozole on 12/01/2019.  On review  of systems, the patient reports chronic pain in bilateral shoulders, right worse than left. She denies lymphadema and any other symptoms.    Allergies:  has No Known Allergies.  Meds: Current Outpatient Medications  Medication Sig Dispense Refill  . aspirin EC 81 MG tablet Take 81 mg by mouth daily.    . calcium carbonate (CALCIUM 600) 600 MG TABS tablet Take 600 mg by mouth daily.    . calcium carbonate (TUMS - DOSED IN MG ELEMENTAL CALCIUM) 500 MG chewable tablet Chew 2-4 tablets by mouth as needed for indigestion or heartburn.    . cholecalciferol (VITAMIN D) 1000 units tablet Take 2,000 Units by mouth daily.    . diclofenac (VOLTAREN) 50 MG EC tablet Take 50 mg by mouth 3 (three) times daily as needed.    . diclofenac Sodium (VOLTAREN) 1 % GEL Voltaren 1 % topical gel  APPLY 2 GRAM TO THE AFFECTED AREA(S) BY TOPICAL ROUTE 4 TIMES PER DAY    . fluorouracil (EFUDEX) 5 % cream Apply 1 application topically 2 (two) times daily.    Marland Kitchen lisinopril (PRINIVIL,ZESTRIL) 20 MG tablet Take 20 mg by mouth daily.     . Multiple Vitamin (MULTIVITAMIN WITH MINERALS) TABS tablet Take 1 tablet by mouth daily.    . rosuvastatin (CRESTOR) 10 MG tablet Take 10 mg by mouth every evening.  0  . tolterodine (DETROL LA) 4 MG 24 hr capsule Take 4 mg by mouth daily.    . vitamin E 100 UNIT capsule Take 100 Units by mouth daily.    Marland Kitchen anastrozole (ARIMIDEX) 1 MG tablet Take 1 tablet (1 mg total) by mouth daily. [place]  tablet 4  . HYDROcodone-acetaminophen (NORCO/VICODIN) 5-325 MG tablet Take 1 tablet by mouth every 6 (six) hours as needed for moderate pain. (Patient not taking: Reported on 10/19/2019) 15 tablet 0   No current facility-administered medications for this encounter.    Physical Findings: The patient is in no acute distress. Patient is alert and oriented.  height is _0  (1.6 m) and weight is 172 lb 3.2 oz (78.1 kg). Her temperature is 98.5 F (36.9 C). Her blood pressure is 140/82 and her pulse is 74.  Her respiration is 20 and oxygen saturation is 97%.  No significant changes. Lungs are clear to auscultation bilaterally. Heart has regular rate and rhythm. No palpable cervical, supraclavicular, or axillary adenopathy. Abdomen soft, non-tender, normal bowel sounds. Right breast: no palpable mass, nipple discharge or bleeding. Left breast: Well-healed scar in the inferior aspect of the breast area.  No signs of infection within the breast or significant swelling.  No dominant mass appreciated in the breast.  Lab Findings: Lab Results  Component Value Date   WBC 6.7 08/25/2019   HGB 13.9 08/25/2019   HCT 41.5 08/25/2019   MCV 91.0 08/25/2019   PLT 231 08/25/2019    Radiographic Findings: No results found.  Impression: Clinical stage IA (pT1c, pNX) Left Breast LOQ, Invasive Lobular Carcinoma, ER+ / PR+ / Her2-, Grade 1  Patient was found to have a close but clear margin on her lumpectomy specimen.  Today we discussed potential radiation therapy as part of her overall management.  Unfortunately the patient's  shoulder situation continues to be a significant issue and she would not be able to position her left arm and shoulder for radiation treatment.  Patient is scheduled to be seen by orthopedic surgery for her left and right shoulder issues.  Plan: Given the shoulder issues as above and  the adequate surgical margins, I would recommend the patient proceed with adjuvant hormonal therapy at this time.  I have spoken with Dr. Jana Stephenson who will get her set up for this therapy.  No plans for radiation therapy at this time.  -----------------------------------  Blair Promise, PhD, MD  This document serves as a record of services personally performed by Gery Pray, MD. It was created on his behalf by Clerance Lav, a trained medical scribe. The creation of this record is based on the scribe's personal observations and the provider's statements to them. This document has been checked and  approved by the attending provider.

## 2019-10-19 NOTE — Patient Instructions (Signed)
Coronavirus (COVID-19) Are you at risk?  Are you at risk for the Coronavirus (COVID-19)?  To be considered HIGH RISK for Coronavirus (COVID-19), you have to meet the following criteria:  . Traveled to [place], [place], [place], [place] or [place]; or in the [place] to [place], [place], [place], or [place]; and have fever, cough, and shortness of breath within the last 2 weeks of travel OR . Been in close contact with a person diagnosed with COVID-19 within the last 2 weeks and have fever, cough, and shortness of breath . IF YOU DO NOT MEET THESE CRITERIA, YOU ARE CONSIDERED LOW RISK FOR COVID-19.  What to do if you are HIGH RISK for COVID-19?  . If you are having a medical emergency, call 911. . Seek medical care right away. Before you go to a doctor's office, urgent care or emergency department, call ahead and tell them about your recent travel, contact with someone diagnosed with COVID-19, and your symptoms. You should receive instructions from your physician's office regarding next steps of care.  . When you arrive at healthcare provider, tell the healthcare staff immediately you have returned from visiting [place], [place], [place], [place] or [place]; or traveled in the [place] to [place], [place], [place], or [place]; in the last two weeks or you have been in close contact with a person diagnosed with COVID-19 in the last 2 weeks.   . Tell the health care staff about your symptoms: fever, cough and shortness of breath. . After you have been seen by a medical provider, you will be either: o Tested for (COVID-19) and discharged home on quarantine except to seek medical care if symptoms worsen, and asked to  - Stay home and avoid contact with others until you get your results (4-5 days)  - Avoid travel on public transportation if possible (such as bus, train, or airplane) or o Sent to the Emergency Department by EMS for evaluation, COVID-19 testing, and possible  admission depending on your condition and test results.  What to do if you are LOW RISK for COVID-19?  Reduce your risk of any infection by using the same precautions used for avoiding the common cold or flu:  . Wash your hands often with soap and warm water for at least 20 seconds.  If soap and water are not readily available, use an alcohol-based hand sanitizer with at least 60% alcohol.  . If coughing or sneezing, cover your mouth and nose by coughing or sneezing into the elbow areas of your shirt or coat, into a tissue or into your sleeve (not your hands). . Avoid shaking hands with others and consider head nods or verbal greetings only. . Avoid touching your eyes, nose, or mouth with unwashed hands.  . Avoid close contact with people who are sick. . Avoid places or events with large numbers of people in one location, like concerts or sporting events. . Carefully consider travel plans you have or are making. . If you are planning any travel outside or inside the [place], visit the CDC's Travelers' Health webpage for the latest health notices. . If you have some symptoms but not all symptoms, continue to monitor at home and seek medical attention if your symptoms worsen. . If you are having a medical emergency, call 911.   ADDITIONAL HEALTHCARE OPTIONS FOR PATIENTS  [facility] / e-Visit: [email or web address]         [facility]: [phone]  [facility]: [phone]                   [facility]: [phone]   

## 2019-10-19 NOTE — Progress Notes (Signed)
Location of Breast Cancer: Malignant neoplasm of lower-inner quadrant of left breast in female, estrogen receptor positive ([place]).  Histology per Pathology Report: 09/08/19: FINAL MICROSCOPIC DIAGNOSIS:   A. BREAST, LEFT, LUMPECTOMY:  - Invasive lobular carcinoma, grade 1, spanning 1.5 cm.  - Lobular neoplasia (atypical lobular hyperplasia).  - Invasive carcinoma is 0.1 cm from the superior margin focally (not on  ink).  - Biopsy site.  - See oncology table.   B. BREAST, LEFT ADDITIONAL MEDIAL MARGIN, EXCISION:  - Benign breast tissue.  Receptor Status: ER(95%), PR (30%), Her2-neu (negative, 2+), Ki-(5%)  Did patient present with symptoms (if so, please note symptoms) or was this found on screening mammography?: [facility]. She denied palpating any masses.She underwent bilateral diagnostic mammography with tomography and leftbreast ultrasonography showing: breast density category B;suspicious 0.9 cm mass at 6-7 o'clock in the left breast; 3 mm benign cyst in the outer retroareolar left breast at 3 o'clock; left axilla negative for lymphadenopathy.  Accordingly on2/4/2021she proceeded to biopsy of the leftbreast area in question. The pathology from this procedure ([id number]) showed:invasive mammary carcinoma, grade 2, e-cadherin negative. Prognostic indicators significant for: estrogen receptor,95%positiveand progesterone receptor, 30%positive, both with strong staining intensity. Proliferation marker Ki67 at5%. HER2equivocalby immunohistochemistry (2+), but negative by fluorescent in situ hybridization with a signals ratio1.4and number per cell 1.75.  Past/Anticipated interventions by surgeon, if any: 09/08/19: Procedure: Left breast seed localized lumpectomy  Surgeon: Erroll Luna, MD  Past/Anticipated interventions by medical oncology, if any: Chemotherapy Dr. Jana Hakim  10/08/19: (1) status post left lumpectomy with no sentinel lymph node sampling 09/08/2019 for a pT1c pNX, stage IA invasive lobular breast cancer, grade 1, with close but negative margins.  (2) adjuvant radiation pending  (3) to start anastrozole December 01, 2019  Lymphedema issues, if any: Pt denies s/s lymphedema.   Pain issues, if any:  Pt reports chronic pain bilateral shoulders, Right worse than Left. Pt reports appt with Dr. Onnie Graham 10/26/19 for second opinion re: shoulders. Pt rates pain 8-9/10. Pt with poor ROM.  SAFETY ISSUES:  Prior radiation? no  Pacemaker/ICD? no  Possible current pregnancy?no  Is the patient on methotrexate? no  Current Complaints / other details:  Pt presents today for f/u new with Dr. Sondra Come for Radiation Oncology. Pt is accompanied by husband, Jacqueline Stephenson.  BP 140/82 (BP Location: Right Arm, Patient Position: Sitting, Cuff Size: Large)   Pulse 74   Temp 98.5 F (36.9 C)   Resp 20   Ht '5\' 3"'  (1.6 m)   Wt 172 lb 3.2 oz (78.1 kg)   SpO2 97%   BMI 30.50 kg/m   Wt Readings from Last 3 Encounters:  10/19/19 172 lb 3.2 oz (78.1 kg)  10/08/19 173 lb 6.4 oz (78.7 kg)  09/08/19 172 lb 2.9 oz (78.1 kg)        Loma Sousa, RN 10/19/2019,2:06 PM

## 2019-10-21 ENCOUNTER — Telehealth: Payer: Self-pay | Admitting: Oncology

## 2019-10-21 NOTE — Telephone Encounter (Signed)
Scheduled appt per 4/19 sch message - referral placed in [facility] - mailed reminder letter with appt date and time

## 2019-10-26 DIAGNOSIS — M19012 Primary osteoarthritis, left shoulder: Secondary | ICD-10-CM | POA: Diagnosis not present

## 2019-10-26 DIAGNOSIS — M25511 Pain in right shoulder: Secondary | ICD-10-CM | POA: Diagnosis not present

## 2019-10-26 DIAGNOSIS — M19011 Primary osteoarthritis, right shoulder: Secondary | ICD-10-CM | POA: Diagnosis not present

## 2019-10-26 DIAGNOSIS — M25512 Pain in left shoulder: Secondary | ICD-10-CM | POA: Diagnosis not present

## 2019-10-28 ENCOUNTER — Telehealth: Payer: Self-pay | Admitting: *Deleted

## 2019-10-28 NOTE — Telephone Encounter (Signed)
Msg received from Dr. Virgie Dad nurse that she would like to move forward with xrt. Msg sent to Dr. Sondra Come and scheduler to schedule appt to see DR.Kinard to discuss xrt.

## 2019-11-02 ENCOUNTER — Ambulatory Visit
Admission: RE | Admit: 2019-11-02 | Discharge: 2019-11-02 | Disposition: A | Discharge status: Home / Self Care | Payer: Medicare Other | Source: Ambulatory Visit | Attending: Radiation Oncology | Admitting: Radiation Oncology

## 2019-11-02 ENCOUNTER — Other Ambulatory Visit: Payer: Self-pay

## 2019-11-02 ENCOUNTER — Encounter: Payer: Self-pay | Admitting: Radiation Oncology

## 2019-11-02 VITALS — BP 154/79 | HR 78 | Temp 98.4°F | Resp 20 | Ht 63.0 in | Wt 170.8 lb

## 2019-11-02 DIAGNOSIS — C50312 Malignant neoplasm of lower-inner quadrant of left female breast: Secondary | ICD-10-CM | POA: Insufficient documentation

## 2019-11-02 DIAGNOSIS — Z79899 Other long term (current) drug therapy: Secondary | ICD-10-CM | POA: Diagnosis not present

## 2019-11-02 DIAGNOSIS — Z9889 Other specified postprocedural states: Secondary | ICD-10-CM | POA: Diagnosis not present

## 2019-11-02 DIAGNOSIS — M19012 Primary osteoarthritis, left shoulder: Secondary | ICD-10-CM | POA: Diagnosis not present

## 2019-11-02 DIAGNOSIS — Z170 Estrogen receptor positive status [ER+]: Secondary | ICD-10-CM | POA: Insufficient documentation

## 2019-11-02 DIAGNOSIS — Z923 Personal history of irradiation: Secondary | ICD-10-CM | POA: Diagnosis not present

## 2019-11-02 DIAGNOSIS — Z510 Encounter for antineoplastic radiation therapy: Secondary | ICD-10-CM | POA: Insufficient documentation

## 2019-11-02 DIAGNOSIS — Z7982 Long term (current) use of aspirin: Secondary | ICD-10-CM | POA: Insufficient documentation

## 2019-11-02 NOTE — Progress Notes (Signed)
Radiation Oncology         [phone] ________________________________  Name: Jacqueline Stephenson MRN: [id number]  Date: 11/02/2019  DOB: 08/25/1940  Re-Evaluation Note  CC: Gaynelle Arabian, MD  No ref. provider found    ICD-10-CM   1. Malignant neoplasm of lower-inner quadrant of left breast in female, estrogen receptor positive ([place])  C50.312    Z17.0     Diagnosis: Clinical stage IA (pT1c, pNX) Left Breast LOQ, Invasive Lobular Carcinoma, ER+ / PR+ / Her2-, Grade 1  Narrative: The patient returns today to discuss radiation treatment options. She was seen in consultation on 08/26/2019. At that time, the patient was considered to be a good candidate for lumpectomy for management of her early stage breast cancer. 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 Given her age, tumor characteristics, and apparent early stage, she had appeared to be a patient who could safely avoid adjuvant radiation therapy assuming she was to proceed with adjuvant hormonal therapy as recommended by Dr. Jana Hakim.   She was seen again on 10/19/2019 after undergoing left breast lumpectomy. Given the shoulder issues as above and the adequate surgical margins, it was recommended that the patient proceed with adjuvant hormonal therapy under the care of Dr. Jana Hakim. There were no plans for radiation at that time and the patient was comfortable with that plan. Since then however, the patient has decided that she would, in fact, like to proceed with radiation therapy.   She was seen by her orthopedic surgeon and received an injection along the left shoulder which is helped with her range of movement significantly.  She now feels she would not be able to attain the treatment position and would like to proceed with adjuvant radiation therapy in addition to adjuvant hormonal therapy.  On review of systems,  the patient reports chronic pain in bilateral shoulders, worse on the right. The patient denies nipple discharge or bleeding.  She denies any problems with swelling in her left arm or hand.  She seems to be tolerating Arimidex well at this time..  Allergies:  has No Known Allergies.  Meds: Current Outpatient Medications  Medication Sig Dispense Refill  . anastrozole (ARIMIDEX) 1 MG tablet Take 1 tablet (1 mg total) by mouth daily. 90 tablet 4  . aspirin EC 81 MG tablet Take 81 mg by mouth daily.    . calcium carbonate (CALCIUM 600) 600 MG TABS tablet Take 600 mg by mouth daily.    . calcium carbonate (TUMS - DOSED IN MG ELEMENTAL CALCIUM) 500 MG chewable tablet Chew 2-4 tablets by mouth as needed for indigestion or heartburn.    . cholecalciferol (VITAMIN D) 1000 units tablet Take 2,000 Units by mouth daily.    . diclofenac (VOLTAREN) 50 MG EC tablet Take 50 mg by mouth 3 (three) times daily as needed.    . diclofenac Sodium (VOLTAREN) 1 % GEL Voltaren 1 % topical gel  APPLY 2 GRAM TO THE AFFECTED AREA(S) BY TOPICAL ROUTE 4 TIMES PER DAY    . fluorouracil (EFUDEX) 5 % cream Apply 1 application topically 2 (two) times daily.    Marland Kitchen HYDROcodone-acetaminophen (NORCO/VICODIN) 5-325 MG tablet Take 1 tablet by mouth every 6 (six) hours as needed for moderate pain. 15 tablet 0  . lisinopril (PRINIVIL,ZESTRIL) 20 MG tablet Take 20 mg by mouth daily.     . Multiple Vitamin (MULTIVITAMIN WITH MINERALS)  TABS tablet Take 1 tablet by mouth daily.    . rosuvastatin (CRESTOR) 10 MG tablet Take 10 mg by mouth every evening.  0  . tolterodine (DETROL LA) 4 MG 24 hr capsule Take 4 mg by mouth daily.    . vitamin E 100 UNIT capsule Take 100 Units by mouth daily.     No current facility-administered medications for this encounter.    Physical Findings: The patient is in no acute distress. Patient is alert and oriented.  height is 5' 3" (1.6 m) and weight is 170 lb 12.8 oz (77.5 kg). Her temperature is 98.4 F  (36.9 C). Her blood pressure is 154/79 (abnormal) and her pulse is 78. Her respiration is 20 and oxygen saturation is 97%.  No significant changes. Lungs are clear to auscultation bilaterally. Heart has regular rate and rhythm. No palpable cervical, supraclavicular, or axillary adenopathy. Abdomen soft, non-tender, normal bowel sounds. Right breast: no palpable mass, nipple discharge or bleeding. Left breast: Well-healed scar in the inferior aspect of the breast lower inner. no signs of infection within the breast or significant swelling.  No dominant mass appreciated in the breast.  Patient was able to place her arm and shoulder in the anticipated treatment position without significant difficulty  Lab Findings: Lab Results  Component Value Date   WBC 6.7 08/25/2019   HGB 13.9 08/25/2019   HCT 41.5 08/25/2019   MCV 91.0 08/25/2019   PLT 231 08/25/2019    Radiographic Findings: No results found.  Impression: Clinical stage IA (pT1c, pNX) Left Breast LIQ, Invasive Lobular Carcinoma, ER+ / PR+ / Her2-, Grade 1  Patient has had significant improvement with her presumed cortisone injection within the left shoulder.  She now be able to proceed with adjuvant radiation therapy planning and treatment  Plan: The patient is scheduled for CT simulation later today.  Treatments to begin next week.  She would be a good candidate for hypofractionated accelerated radiation therapy over approximately 4 weeks.  -----------------------------------  Blair Promise, PhD, MD  This document serves as a record of services personally performed by Gery Pray, MD. It was created on his behalf by Clerance Lav, a trained medical scribe. The creation of this record is based on the scribe's personal observations and the provider's statements to them. This document has been checked and approved by the attending provider.

## 2019-11-02 NOTE — Progress Notes (Signed)
Location of Breast Cancer: Malignant neoplasm of lower-inner quadrant of left breast in female, estrogen receptor positive ([place]).  Histology per Pathology Report: 09/08/19: FINAL MICROSCOPIC DIAGNOSIS:   A. BREAST, LEFT, LUMPECTOMY:  - Invasive lobular carcinoma, grade 1, spanning 1.5 cm.  - Lobular neoplasia (atypical lobular hyperplasia).  - Invasive carcinoma is 0.1 cm from the superior margin focally (not on  ink).  - Biopsy site.  - See oncology table.   B. BREAST, LEFT ADDITIONAL MEDIAL MARGIN, EXCISION:  - Benign breast tissue.  Receptor Status: ER(95%), PR (30%), Her2-neu (negative, 2+), Ki-(5%)  Did patient present with symptoms (if so, please note symptoms) or was this found on screening mammography?: [facility]ilton [facility] n[facility] inversion. She denied palpating any masses.She underwent bilateral diagnostic mammography with tomography and leftbreast ultrasonography showing: breast density category B;suspicious 0.9 cm mass at 6-7 o'clock in the left breast; 3 mm benign cyst in the outer retroareolar left breast at 3 o'clock; left axilla negative for lymphadenopathy.  Accordingly on2/4/2021she proceeded to biopsy of the leftbreast area in question. The pathology from this procedure ([id number]) showed:invasive mammary carcinoma, grade 2, e-cadherin negative. Prognostic indicators significant for: estrogen receptor,95%positiveand progesterone receptor, 30%positive, both with strong staining intensity. Proliferation marker Ki67 at5%. HER2equivocalby immunohistochemistry (2+), but negative by fluorescent in situ hybridization with a signals ratio1.4and number per cell 1.75.  Past/Anticipated interventions by surgeon, if any: 09/08/19: Procedure: Left breast seed localized lumpectomy  Surgeon: Erroll Luna, MD  Past/Anticipated interventions by medical oncology, if any: Chemotherapy Dr. Jana Hakim  10/08/19: (1) status post left lumpectomy with no sentinel lymph node sampling 09/08/2019 for a pT1c pNX, stage IA invasive lobular breast cancer, grade 1, with close but negative margins.  (2) adjuvant radiation pending  (3) to start anastrozole December 01, 2019  Lymphedema issues, if any: Pt denies s/s lymphedema.   Pain issues, if any:  Pt reports chronic pain bilateral shoulders, Right worse than Left. Pt reports appt with Dr. Onnie Graham 10/26/19 for second opinion re: shoulders. Pt rates pain 8-9/10. Pt with poor ROM.  SAFETY ISSUES:  Prior radiation? no  Pacemaker/ICD? no  Possible current pregnancy?no  Is the patient on methotrexate? no  Current Complaints / other details:  Pt presents today for f/u new with Dr. Sondra Come for Radiation Oncology. Pt is accompanied by husband, Jacqueline Stephenson.          Cori Razor, RN 11/02/2019,1:58 PM

## 2019-11-05 ENCOUNTER — Encounter: Payer: Self-pay | Admitting: *Deleted

## 2019-11-05 DIAGNOSIS — Z510 Encounter for antineoplastic radiation therapy: Secondary | ICD-10-CM | POA: Diagnosis not present

## 2019-11-05 DIAGNOSIS — C50312 Malignant neoplasm of lower-inner quadrant of left female breast: Secondary | ICD-10-CM | POA: Diagnosis not present

## 2019-11-05 DIAGNOSIS — Z170 Estrogen receptor positive status [ER+]: Secondary | ICD-10-CM | POA: Diagnosis not present

## 2019-11-09 ENCOUNTER — Ambulatory Visit
Admission: RE | Admit: 2019-11-09 | Discharge: 2019-11-09 | Disposition: A | Discharge status: Home / Self Care | Payer: Medicare Other | Source: Ambulatory Visit | Attending: Radiation Oncology | Admitting: Radiation Oncology

## 2019-11-09 ENCOUNTER — Other Ambulatory Visit: Payer: Self-pay

## 2019-11-09 DIAGNOSIS — Z170 Estrogen receptor positive status [ER+]: Secondary | ICD-10-CM

## 2019-11-09 DIAGNOSIS — C50312 Malignant neoplasm of lower-inner quadrant of left female breast: Secondary | ICD-10-CM

## 2019-11-09 DIAGNOSIS — Z510 Encounter for antineoplastic radiation therapy: Secondary | ICD-10-CM | POA: Diagnosis not present

## 2019-11-09 NOTE — Progress Notes (Signed)
  Radiation Oncology         [phone]) [phone] ________________________________  Name: Jacqueline Stephenson MRN: [id number]  Date: 11/09/2019  DOB: 1940-07-08  Simulation Verification Note    ICD-10-CM   1. Malignant neoplasm of lower-inner quadrant of left breast in female, estrogen receptor positive ([place])  C50.312    Z17.0     NARRATIVE: The patient was brought to the treatment unit and placed in the planned treatment position. The clinical setup was verified. Then port films were obtained and uploaded to the radiation oncology medical record software.  The treatment beams were carefully compared against the planned radiation fields. The position location and shape of the radiation fields was reviewed. They targeted volume of tissue appears to be appropriately covered by the radiation beams. Organs at risk appear to be excluded as planned.  Based on my personal review, I approved the simulation verification. The patient's treatment will proceed as planned.  -----------------------------------  Blair Promise, PhD, MD  This document serves as a record of services personally performed by Gery Pray, MD. It was created on his behalf by Clerance Lav, a trained medical scribe. The creation of this record is based on the scribe's personal observations and the provider's statements to them. This document has been checked and approved by the attending provider.

## 2019-11-10 ENCOUNTER — Ambulatory Visit
Admission: RE | Admit: 2019-11-10 | Discharge: 2019-11-10 | Disposition: A | Discharge status: Home / Self Care | Payer: Medicare Other | Source: Ambulatory Visit | Attending: Radiation Oncology | Admitting: Radiation Oncology

## 2019-11-10 ENCOUNTER — Other Ambulatory Visit: Payer: Self-pay

## 2019-11-10 DIAGNOSIS — C50312 Malignant neoplasm of lower-inner quadrant of left female breast: Secondary | ICD-10-CM | POA: Diagnosis not present

## 2019-11-10 DIAGNOSIS — Z510 Encounter for antineoplastic radiation therapy: Secondary | ICD-10-CM | POA: Diagnosis not present

## 2019-11-10 DIAGNOSIS — Z170 Estrogen receptor positive status [ER+]: Secondary | ICD-10-CM | POA: Diagnosis not present

## 2019-11-11 ENCOUNTER — Other Ambulatory Visit: Payer: Self-pay

## 2019-11-11 ENCOUNTER — Ambulatory Visit
Admission: RE | Admit: 2019-11-11 | Discharge: 2019-11-11 | Disposition: A | Discharge status: Home / Self Care | Payer: Medicare Other | Source: Ambulatory Visit | Attending: Radiation Oncology | Admitting: Radiation Oncology

## 2019-11-11 DIAGNOSIS — Z170 Estrogen receptor positive status [ER+]: Secondary | ICD-10-CM | POA: Diagnosis not present

## 2019-11-11 DIAGNOSIS — Z510 Encounter for antineoplastic radiation therapy: Secondary | ICD-10-CM | POA: Diagnosis not present

## 2019-11-11 DIAGNOSIS — C50312 Malignant neoplasm of lower-inner quadrant of left female breast: Secondary | ICD-10-CM | POA: Diagnosis not present

## 2019-11-12 ENCOUNTER — Ambulatory Visit
Admission: RE | Admit: 2019-11-12 | Discharge: 2019-11-12 | Disposition: A | Discharge status: Home / Self Care | Payer: Medicare Other | Source: Ambulatory Visit | Attending: Radiation Oncology | Admitting: Radiation Oncology

## 2019-11-12 ENCOUNTER — Other Ambulatory Visit: Payer: Self-pay

## 2019-11-12 DIAGNOSIS — Z510 Encounter for antineoplastic radiation therapy: Secondary | ICD-10-CM | POA: Diagnosis not present

## 2019-11-12 DIAGNOSIS — C50312 Malignant neoplasm of lower-inner quadrant of left female breast: Secondary | ICD-10-CM | POA: Diagnosis not present

## 2019-11-12 DIAGNOSIS — Z170 Estrogen receptor positive status [ER+]: Secondary | ICD-10-CM | POA: Diagnosis not present

## 2019-11-13 ENCOUNTER — Ambulatory Visit
Admission: RE | Admit: 2019-11-13 | Discharge: 2019-11-13 | Disposition: A | Discharge status: Home / Self Care | Payer: Medicare Other | Source: Ambulatory Visit | Attending: Radiation Oncology | Admitting: Radiation Oncology

## 2019-11-13 ENCOUNTER — Other Ambulatory Visit: Payer: Self-pay

## 2019-11-13 DIAGNOSIS — C50312 Malignant neoplasm of lower-inner quadrant of left female breast: Secondary | ICD-10-CM | POA: Diagnosis not present

## 2019-11-13 DIAGNOSIS — Z510 Encounter for antineoplastic radiation therapy: Secondary | ICD-10-CM | POA: Diagnosis not present

## 2019-11-13 DIAGNOSIS — Z170 Estrogen receptor positive status [ER+]: Secondary | ICD-10-CM | POA: Diagnosis not present

## 2019-11-16 ENCOUNTER — Other Ambulatory Visit: Payer: Self-pay

## 2019-11-16 ENCOUNTER — Ambulatory Visit
Admission: RE | Admit: 2019-11-16 | Discharge: 2019-11-16 | Disposition: A | Discharge status: Home / Self Care | Payer: Medicare Other | Source: Ambulatory Visit | Attending: Radiation Oncology | Admitting: Radiation Oncology

## 2019-11-16 DIAGNOSIS — C50312 Malignant neoplasm of lower-inner quadrant of left female breast: Secondary | ICD-10-CM | POA: Diagnosis not present

## 2019-11-16 DIAGNOSIS — Z170 Estrogen receptor positive status [ER+]: Secondary | ICD-10-CM | POA: Diagnosis not present

## 2019-11-16 DIAGNOSIS — Z510 Encounter for antineoplastic radiation therapy: Secondary | ICD-10-CM | POA: Diagnosis not present

## 2019-11-17 ENCOUNTER — Ambulatory Visit
Admission: RE | Admit: 2019-11-17 | Discharge: 2019-11-17 | Disposition: A | Discharge status: Home / Self Care | Payer: Medicare Other | Source: Ambulatory Visit | Attending: Radiation Oncology | Admitting: Radiation Oncology

## 2019-11-17 ENCOUNTER — Other Ambulatory Visit: Payer: Self-pay

## 2019-11-17 DIAGNOSIS — C50312 Malignant neoplasm of lower-inner quadrant of left female breast: Secondary | ICD-10-CM | POA: Diagnosis not present

## 2019-11-17 DIAGNOSIS — Z510 Encounter for antineoplastic radiation therapy: Secondary | ICD-10-CM | POA: Diagnosis not present

## 2019-11-17 DIAGNOSIS — Z170 Estrogen receptor positive status [ER+]: Secondary | ICD-10-CM | POA: Diagnosis not present

## 2019-11-18 ENCOUNTER — Other Ambulatory Visit: Payer: Self-pay

## 2019-11-18 ENCOUNTER — Ambulatory Visit
Admission: RE | Admit: 2019-11-18 | Discharge: 2019-11-18 | Disposition: A | Discharge status: Home / Self Care | Payer: Medicare Other | Source: Ambulatory Visit | Attending: Radiation Oncology | Admitting: Radiation Oncology

## 2019-11-18 DIAGNOSIS — C50312 Malignant neoplasm of lower-inner quadrant of left female breast: Secondary | ICD-10-CM | POA: Diagnosis not present

## 2019-11-18 DIAGNOSIS — Z510 Encounter for antineoplastic radiation therapy: Secondary | ICD-10-CM | POA: Diagnosis not present

## 2019-11-18 DIAGNOSIS — Z170 Estrogen receptor positive status [ER+]: Secondary | ICD-10-CM | POA: Diagnosis not present

## 2019-11-19 ENCOUNTER — Other Ambulatory Visit: Payer: Self-pay

## 2019-11-19 ENCOUNTER — Ambulatory Visit
Admission: RE | Admit: 2019-11-19 | Discharge: 2019-11-19 | Disposition: A | Discharge status: Home / Self Care | Payer: Medicare Other | Source: Ambulatory Visit | Attending: Radiation Oncology | Admitting: Radiation Oncology

## 2019-11-19 DIAGNOSIS — C50312 Malignant neoplasm of lower-inner quadrant of left female breast: Secondary | ICD-10-CM | POA: Diagnosis not present

## 2019-11-19 DIAGNOSIS — Z170 Estrogen receptor positive status [ER+]: Secondary | ICD-10-CM | POA: Diagnosis not present

## 2019-11-19 DIAGNOSIS — Z510 Encounter for antineoplastic radiation therapy: Secondary | ICD-10-CM | POA: Diagnosis not present

## 2019-11-20 ENCOUNTER — Ambulatory Visit
Admission: RE | Admit: 2019-11-20 | Discharge: 2019-11-20 | Disposition: A | Discharge status: Home / Self Care | Payer: Medicare Other | Source: Ambulatory Visit | Attending: Radiation Oncology | Admitting: Radiation Oncology

## 2019-11-20 ENCOUNTER — Other Ambulatory Visit: Payer: Self-pay

## 2019-11-20 DIAGNOSIS — C50312 Malignant neoplasm of lower-inner quadrant of left female breast: Secondary | ICD-10-CM | POA: Diagnosis not present

## 2019-11-20 DIAGNOSIS — Z510 Encounter for antineoplastic radiation therapy: Secondary | ICD-10-CM | POA: Diagnosis not present

## 2019-11-20 DIAGNOSIS — Z170 Estrogen receptor positive status [ER+]: Secondary | ICD-10-CM | POA: Diagnosis not present

## 2019-11-23 ENCOUNTER — Other Ambulatory Visit: Payer: Self-pay

## 2019-11-23 ENCOUNTER — Ambulatory Visit
Admission: RE | Admit: 2019-11-23 | Discharge: 2019-11-23 | Disposition: A | Discharge status: Home / Self Care | Payer: Medicare Other | Source: Ambulatory Visit | Attending: Radiation Oncology | Admitting: Radiation Oncology

## 2019-11-23 DIAGNOSIS — Z170 Estrogen receptor positive status [ER+]: Secondary | ICD-10-CM | POA: Diagnosis not present

## 2019-11-23 DIAGNOSIS — C50312 Malignant neoplasm of lower-inner quadrant of left female breast: Secondary | ICD-10-CM | POA: Diagnosis not present

## 2019-11-23 DIAGNOSIS — Z510 Encounter for antineoplastic radiation therapy: Secondary | ICD-10-CM | POA: Diagnosis not present

## 2019-11-24 ENCOUNTER — Other Ambulatory Visit: Payer: Self-pay

## 2019-11-24 ENCOUNTER — Ambulatory Visit
Admission: RE | Admit: 2019-11-24 | Discharge: 2019-11-24 | Disposition: A | Discharge status: Home / Self Care | Payer: Medicare Other | Source: Ambulatory Visit | Attending: Radiation Oncology | Admitting: Radiation Oncology

## 2019-11-24 ENCOUNTER — Ambulatory Visit: Payer: Medicare Other | Admitting: Radiation Oncology

## 2019-11-24 DIAGNOSIS — C50312 Malignant neoplasm of lower-inner quadrant of left female breast: Secondary | ICD-10-CM | POA: Diagnosis not present

## 2019-11-24 DIAGNOSIS — Z510 Encounter for antineoplastic radiation therapy: Secondary | ICD-10-CM | POA: Diagnosis not present

## 2019-11-24 DIAGNOSIS — Z170 Estrogen receptor positive status [ER+]: Secondary | ICD-10-CM

## 2019-11-24 MED ORDER — SONAFINE EX EMUL
1.0000 "application " | Freq: Two times a day (BID) | CUTANEOUS | Status: DC
  Administered 2019-11-24: 1 via TOPICAL

## 2019-11-25 ENCOUNTER — Ambulatory Visit
Admission: RE | Admit: 2019-11-25 | Discharge: 2019-11-25 | Disposition: A | Discharge status: Home / Self Care | Payer: Medicare Other | Source: Ambulatory Visit | Attending: Radiation Oncology | Admitting: Radiation Oncology

## 2019-11-25 ENCOUNTER — Other Ambulatory Visit: Payer: Self-pay

## 2019-11-25 DIAGNOSIS — C50312 Malignant neoplasm of lower-inner quadrant of left female breast: Secondary | ICD-10-CM | POA: Diagnosis not present

## 2019-11-25 DIAGNOSIS — Z510 Encounter for antineoplastic radiation therapy: Secondary | ICD-10-CM | POA: Diagnosis not present

## 2019-11-25 DIAGNOSIS — Z170 Estrogen receptor positive status [ER+]: Secondary | ICD-10-CM | POA: Diagnosis not present

## 2019-11-26 ENCOUNTER — Ambulatory Visit
Admission: RE | Admit: 2019-11-26 | Discharge: 2019-11-26 | Disposition: A | Discharge status: Home / Self Care | Payer: Medicare Other | Source: Ambulatory Visit | Attending: Radiation Oncology | Admitting: Radiation Oncology

## 2019-11-26 ENCOUNTER — Other Ambulatory Visit: Payer: Self-pay

## 2019-11-26 ENCOUNTER — Telehealth: Payer: Self-pay

## 2019-11-26 DIAGNOSIS — C50312 Malignant neoplasm of lower-inner quadrant of left female breast: Secondary | ICD-10-CM | POA: Diagnosis not present

## 2019-11-26 DIAGNOSIS — Z510 Encounter for antineoplastic radiation therapy: Secondary | ICD-10-CM | POA: Diagnosis not present

## 2019-11-26 DIAGNOSIS — Z170 Estrogen receptor positive status [ER+]: Secondary | ICD-10-CM | POA: Diagnosis not present

## 2019-11-26 NOTE — Telephone Encounter (Signed)
Pt states she is having intermittent constipation but normally has a BM every day, however stool has been more difficult to pass. Suggested to pt try Miralax as directed as well as drinking plenty of fluids, and if this does not help to return call to office. Pt verbalized thanks and understanding.

## 2019-11-27 ENCOUNTER — Other Ambulatory Visit: Payer: Self-pay

## 2019-11-27 ENCOUNTER — Ambulatory Visit
Admission: RE | Admit: 2019-11-27 | Discharge: 2019-11-27 | Disposition: A | Discharge status: Home / Self Care | Payer: Medicare Other | Source: Ambulatory Visit | Attending: Radiation Oncology | Admitting: Radiation Oncology

## 2019-11-27 DIAGNOSIS — C50312 Malignant neoplasm of lower-inner quadrant of left female breast: Secondary | ICD-10-CM | POA: Diagnosis not present

## 2019-11-27 DIAGNOSIS — Z510 Encounter for antineoplastic radiation therapy: Secondary | ICD-10-CM | POA: Diagnosis not present

## 2019-11-27 DIAGNOSIS — Z170 Estrogen receptor positive status [ER+]: Secondary | ICD-10-CM | POA: Diagnosis not present

## 2019-12-01 ENCOUNTER — Ambulatory Visit
Admission: RE | Admit: 2019-12-01 | Discharge: 2019-12-01 | Disposition: A | Discharge status: Home / Self Care | Payer: Medicare Other | Source: Ambulatory Visit | Attending: Radiation Oncology | Admitting: Radiation Oncology

## 2019-12-01 ENCOUNTER — Other Ambulatory Visit: Payer: Self-pay

## 2019-12-01 DIAGNOSIS — Z510 Encounter for antineoplastic radiation therapy: Secondary | ICD-10-CM | POA: Insufficient documentation

## 2019-12-01 DIAGNOSIS — Z170 Estrogen receptor positive status [ER+]: Secondary | ICD-10-CM | POA: Diagnosis present

## 2019-12-01 DIAGNOSIS — C50312 Malignant neoplasm of lower-inner quadrant of left female breast: Secondary | ICD-10-CM | POA: Insufficient documentation

## 2019-12-02 ENCOUNTER — Other Ambulatory Visit: Payer: Self-pay

## 2019-12-02 ENCOUNTER — Ambulatory Visit
Admission: RE | Admit: 2019-12-02 | Discharge: 2019-12-02 | Disposition: A | Discharge status: Home / Self Care | Payer: Medicare Other | Source: Ambulatory Visit | Attending: Radiation Oncology | Admitting: Radiation Oncology

## 2019-12-02 DIAGNOSIS — C50312 Malignant neoplasm of lower-inner quadrant of left female breast: Secondary | ICD-10-CM | POA: Diagnosis not present

## 2019-12-03 ENCOUNTER — Ambulatory Visit
Admission: RE | Admit: 2019-12-03 | Discharge: 2019-12-03 | Disposition: A | Discharge status: Home / Self Care | Payer: Medicare Other | Source: Ambulatory Visit | Attending: Radiation Oncology | Admitting: Radiation Oncology

## 2019-12-03 ENCOUNTER — Other Ambulatory Visit: Payer: Self-pay

## 2019-12-03 DIAGNOSIS — C50312 Malignant neoplasm of lower-inner quadrant of left female breast: Secondary | ICD-10-CM | POA: Diagnosis not present

## 2019-12-04 ENCOUNTER — Ambulatory Visit
Admission: RE | Admit: 2019-12-04 | Discharge: 2019-12-04 | Disposition: A | Discharge status: Home / Self Care | Payer: Medicare Other | Source: Ambulatory Visit | Attending: Radiation Oncology | Admitting: Radiation Oncology

## 2019-12-04 DIAGNOSIS — C50312 Malignant neoplasm of lower-inner quadrant of left female breast: Secondary | ICD-10-CM

## 2019-12-04 DIAGNOSIS — Z170 Estrogen receptor positive status [ER+]: Secondary | ICD-10-CM

## 2019-12-04 MED ORDER — SONAFINE EX EMUL
1.0000 "application " | Freq: Two times a day (BID) | CUTANEOUS | Status: DC
  Administered 2019-12-04: 1 via TOPICAL

## 2019-12-07 ENCOUNTER — Other Ambulatory Visit: Payer: Self-pay

## 2019-12-07 ENCOUNTER — Ambulatory Visit
Admission: RE | Admit: 2019-12-07 | Discharge: 2019-12-07 | Disposition: A | Discharge status: Home / Self Care | Payer: Medicare Other | Source: Ambulatory Visit | Attending: Radiation Oncology | Admitting: Radiation Oncology

## 2019-12-07 DIAGNOSIS — C50312 Malignant neoplasm of lower-inner quadrant of left female breast: Secondary | ICD-10-CM | POA: Diagnosis not present

## 2019-12-08 ENCOUNTER — Ambulatory Visit
Admission: RE | Admit: 2019-12-08 | Discharge: 2019-12-08 | Disposition: A | Discharge status: Home / Self Care | Payer: Medicare Other | Source: Ambulatory Visit | Attending: Radiation Oncology | Admitting: Radiation Oncology

## 2019-12-08 ENCOUNTER — Other Ambulatory Visit: Payer: Self-pay

## 2019-12-08 ENCOUNTER — Encounter: Payer: Self-pay | Admitting: Radiation Oncology

## 2019-12-08 DIAGNOSIS — C50312 Malignant neoplasm of lower-inner quadrant of left female breast: Secondary | ICD-10-CM | POA: Diagnosis not present

## 2019-12-09 NOTE — Progress Notes (Signed)
[place]  [phone]) [phone] [phone]     ID: Jacqueline Stephenson DOB: 08-31-1940  MR#: [id number]  ZHY#:[id number]  Patient Care Team: Jacqueline Arabian, MD as PCP - General (Family Medicine) Jacqueline Luna, MD as Consulting Physician (General Surgery) Jacqueline Stephenson, Jacqueline Dad, MD as Consulting Physician (Oncology) Jacqueline Pearson, MD as Consulting Physician (Obstetrics and Gynecology) Jacqueline Pray, MD as Consulting Physician (Radiation Oncology) Jacqueline Kaufmann, RN as Oncology Nurse Navigator Jacqueline Germany, RN as Oncology Nurse Navigator Jacqueline Britain, MD as Consulting Physician (Orthopedic Surgery) Jacqueline Cruel, MD OTHER MD:   CHIEF COMPLAINT: Estrogen receptor positive lobular breast cancer  CURRENT TREATMENT: Anastrozole   INTERVAL HISTORY: Jacqueline Stephenson returns today for follow up of her estrogen receptor positive lobular breast cancer accompanied by her husband Jacqueline Stephenson.  Since her last visit, she was referred back to Jacqueline Stephenson on 10/19/2019. At that time, she opted against radiation and decided to proceed with anastrozole alone. However, she reached out to Jacqueline Stephenson nurse stating she wanted to proceed with radiation therapy. She met back with Jacqueline Stephenson on 11/02/2019 and received treatment from 11/09/2019 through 12/08/2019.   REVIEW OF SYSTEMS: Jacqueline Stephenson did well with her radiation.  She still has a slight rash in the mid left upper chest and she is continuing to use the cream she received from radiation oncology and that is helping.  She had an episode of constipation which was severe and unpleasant.  She took care of that with some over-the-counter suppositories and it has not recurred.  They have met with Dr. Onnie Stephenson and they are considering shoulder replacement surgery.  In the meantime she is taking diclofenac and she wanted me to refill that because they will be going to [place] soon for a couple of weeks and they will not be seeing Dr. Onnie Stephenson until  after they get back.  Aside from these issues a detailed review of systems today was stable   HISTORY OF CURRENT ILLNESS: From the original intake note:  Jacqueline Stephenson presented for her annual mammogram at [facility] on 07/30/2019 with left nipple inversion. She denied palpating any masses. She underwent bilateral diagnostic mammography with tomography and left breast ultrasonography showing: breast density category B; suspicious 0.9 cm mass at 6-7 o'clock in the left breast; 3 mm benign cyst in the outer retroareolar left breast at 3 o'clock; left axilla negative for lymphadenopathy.  Accordingly on 08/06/2019 she proceeded to biopsy of the left breast area in question. The pathology from this procedure ([id number]) showed: invasive mammary carcinoma, grade 2, e-cadherin negative. Prognostic indicators significant for: estrogen receptor, 95% positive and progesterone receptor, 30% positive, both with strong staining intensity. Proliferation marker Ki67 at 5%. HER2 equivocal by immunohistochemistry (2+), but negative by fluorescent in situ hybridization with a signals ratio 1.4 and number per cell 1.75.  She underwent breast MRI yesterday, 08/24/2019, which showed: No suspicious masses in the right breast no abnormal appearing lymph nodes complex mass in the lower outer left breast which is postbiopsy change and hematoma primarily.  She has an appointment with Jacqueline Stephenson 08/26/2019.  She is scheduled for left lumpectomy on 09/08/2019 under Dr. Brantley Stephenson.  The patient's subsequent history is as detailed below.   PAST MEDICAL HISTORY: Past Medical History:  Diagnosis Date   Arthritis    Bladder spasms    Breast cancer ([place]) 2021   Left   Diabetes mellitus without complication (HCC)    GERD (gastroesophageal reflux disease)    High  cholesterol    Hypertension    Shingles    Vertigo     PAST SURGICAL HISTORY: Past Surgical History:  Procedure Laterality Date   BREAST  EXCISIONAL BIOPSY     BREAST LUMPECTOMY WITH RADIOACTIVE SEED LOCALIZATION Left 09/08/2019   Procedure: LEFT BREAST LUMPECTOMY WITH RADIOACTIVE SEED LOCALIZATION;  Surgeon: Jacqueline Luna, MD;  Location: [facility];  Service: General;  Laterality: Left;   TRIGGER FINGER RELEASE Left     FAMILY HISTORY: Family History  Problem Relation Age of Onset   Heart disease Mother    Heart disease Father    Jacqueline Stephenson father died in his late 27s from heart disease.  He was a smoker.  The patient's mother died at age 4 also from heart disease.  She was not a smoker.  There is no breast ovarian pancreatic or prostate cancer in the family to the patient's knowledge.  The patient has no brothers or sisters.   GYNECOLOGIC HISTORY:  No LMP recorded. Patient is postmenopausal. Menarche: 79 years old Age at first live birth: 79 years old [place] P 1 LMP late 38s Contraceptive: Oral contraceptives less than 1 year HRT no  Hysterectomy? no BSO?  No   SOCIAL HISTORY: (updated 08/2019)  Jacqueline Stephenson worked at [employer] and light as did her husband Risk manager.  He is a former [employer] fast streamer.  They are both retired.  They have been married more than 50 years.  Their child Jacqueline Stephenson, 33 years old, lives in [place] and works usually for [employer]  Jacqueline Stephenson has a child from an earlier marriage.  The patient has 2 grandchildren.  She attends a local [employer] DIRECTIVES: In the absence of any documentation to the contrary, the patient's spouse is their HCPOA.    HEALTH MAINTENANCE: Social History   Tobacco Use   Smoking status: Never Smoker   Smokeless tobacco: Never Used  Substance Use Topics   Alcohol use: Yes    Comment: Socially   Drug use: Not Currently     Colonoscopy: [facility]  PAP: 2021  Bone density: "Normal" per patient   No Known Allergies  Current Outpatient Medications  Medication Sig Dispense Refill   anastrozole (ARIMIDEX) 1 MG tablet Take 1 tablet  (1 mg total) by mouth daily. 90 tablet 4   aspirin EC 81 MG tablet Take 81 mg by mouth daily.     calcium carbonate (TUMS - DOSED IN MG ELEMENTAL CALCIUM) 500 MG chewable tablet Chew 2-4 tablets by mouth as needed for indigestion or heartburn.     cholecalciferol (VITAMIN D) 1000 units tablet Take 2,000 Units by mouth daily.     diclofenac (VOLTAREN) 50 MG EC tablet Take 1 tablet (50 mg total) by mouth 2 (two) times daily as needed. 60 tablet 1   diclofenac Sodium (VOLTAREN) 1 % GEL Voltaren 1 % topical gel  APPLY 2 GRAM TO THE AFFECTED AREA(S) BY TOPICAL ROUTE 4 TIMES PER DAY     fluorouracil (EFUDEX) 5 % cream Apply 1 application topically 2 (two) times daily.     lisinopril (PRINIVIL,ZESTRIL) 20 MG tablet Take 20 mg by mouth daily.      Multiple Vitamin (MULTIVITAMIN WITH MINERALS) TABS tablet Take 1 tablet by mouth daily.     rosuvastatin (CRESTOR) 10 MG tablet Take 10 mg by mouth every evening.  0   tolterodine (DETROL LA) 4 MG 24 hr capsule Take 4 mg by mouth daily.     vitamin  E 100 UNIT capsule Take 100 Units by mouth daily.     No current facility-administered medications for this visit.    OBJECTIVE:  white woman who appears stated age  45:   12/10/19 1442  BP: (!) 149/81  Pulse: 81  Resp: 17  Temp: 98.7 F (37.1 C)  SpO2: 97%     Body mass index is 30.95 kg/m.   Wt Readings from Last 3 Encounters:  12/10/19 174 lb 11.2 oz (79.2 kg)  11/02/19 170 lb 12.8 oz (77.5 kg)  10/19/19 172 lb 3.2 oz (78.1 kg)      ECOG FS:1 - Symptomatic but completely ambulatory  Sclerae unicteric, EOMs intact Wearing a mask No cervical or supraclavicular adenopathy Lungs no rales or rhonchi Heart regular rate and rhythm Abd soft, nontender, positive bowel sounds MSK no focal spinal tenderness, very limited range of motion both upper extremities secondary to pain Neuro: nonfocal, well oriented, appropriate affect Breasts: The right breast is benign.  The left breast has  undergone lumpectomy and radiation.  The cosmetic result is excellent.  There is minimal hyperpigmentation.  There is a mild scattered rash which is erythematous and not pustular in the mid anterior left chest.  Both axillae are benign.    LAB RESULTS:  CMP     Component Value Date/Time   NA 140 12/10/2019 1427   K 4.4 12/10/2019 1427   CL 106 12/10/2019 1427   CO2 28 12/10/2019 1427   GLUCOSE 99 12/10/2019 1427   BUN 15 12/10/2019 1427   CREATININE 0.99 12/10/2019 1427   CREATININE 1.08 (H) 08/25/2019 1524   CALCIUM 9.7 12/10/2019 1427   PROT 7.2 12/10/2019 1427   ALBUMIN 3.7 12/10/2019 1427   AST 19 12/10/2019 1427   AST 19 08/25/2019 1524   ALT 16 12/10/2019 1427   ALT 18 08/25/2019 1524   ALKPHOS 43 12/10/2019 1427   BILITOT 0.3 12/10/2019 1427   BILITOT 0.4 08/25/2019 1524   GFRNONAA 54 (L) 12/10/2019 1427   GFRNONAA 49 (L) 08/25/2019 1524   GFRAA >60 12/10/2019 1427   GFRAA 57 (L) 08/25/2019 1524    No results found for: TOTALPROTELP, ALBUMINELP, A1GS, A2GS, BETS, BETA2SER, GAMS, MSPIKE, SPEI  Lab Results  Component Value Date   WBC 6.7 12/10/2019   NEUTROABS 4.4 12/10/2019   HGB 13.4 12/10/2019   HCT 40.3 12/10/2019   MCV 89.8 12/10/2019   PLT 192 12/10/2019    No results found for: LABCA2  No components found for: QHUTML465  No results for input(s): INR in the last 168 hours.  No results found for: LABCA2  No results found for: KPT465  No results found for: KCL275  No results found for: TZG017  No results found for: CA2729  No components found for: HGQUANT  No results found for: CEA1 / No results found for: CEA1   No results found for: AFPTUMOR  No results found for: CHROMOGRNA  No results found for: KPAFRELGTCHN, LAMBDASER, KAPLAMBRATIO (kappa/lambda light chains)  No results found for: HGBA, HGBA2QUANT, HGBFQUANT, HGBSQUAN (Hemoglobinopathy evaluation)   No results found for: LDH  No results found for: IRON, TIBC,  IRONPCTSAT (Iron and TIBC)  No results found for: FERRITIN  Urinalysis    Component Value Date/Time   COLORURINE YELLOW 12/02/2017 [place] 12/02/2017 1243   LABSPEC 1.014 12/02/2017 1243   PHURINE 6.0 12/02/2017 [place] 12/02/2017 [place] 12/02/2017 [place] NEGATIVE 12/02/2017 1243  KETONESUR NEGATIVE 12/02/2017 1243   PROTEINUR NEGATIVE 12/02/2017 1243   NITRITE NEGATIVE 12/02/2017 1243   LEUKOCYTESUR TRACE (A) 12/02/2017 1243    STUDIES: No results found.   ELIGIBLE FOR AVAILABLE RESEARCH PROTOCOL: AET  ASSESSMENT: 79 y.o. [place] woman status post left breast lower inner quadrant biopsy 08/06/2019 for a clinical T1b N0, Stephenson IA invasive lobular carcinoma, E-cadherin negative, grade 2, estrogen and progesterone receptor positive, HER-2 not amplified, with an MIB-1 of 5%  (1) status post left lumpectomy with no sentinel lymph node sampling 09/08/2019 for a pT1c pNX, Stephenson IA invasive lobular breast cancer, grade 1, with close but negative margins.  (2) adjuvant radiation 11/09/2019 through 12/08/2019  (3) started anastrozole 10/31/2019   PLAN:  Nicol tolerated radiation well.  She still has a little bit of erythema and a little bit of a rash and she is taking care of that with the creams and they have provided for her.  They are planning to travel to [place].  She wanted to make sure she had all her medications refilled appropriately and I was glad to do that for her.  She is tolerating anastrozole quite well.  She is not having problems with hot flashes or vaginal dryness and she has had no problems with arthralgias or myalgias.  Accordingly I am changing the August appointment to a virtual visit.  She will see me in person in October  She knows to call for any other issue that may develop before then  Total encounter time 25 minutes.Jacqueline Cruel, MD   12/10/2019 3:05 PM Medical  Oncology and Hematology [facility] [place], [place] [address] Tel. [phone]    Fax. [phone]   This document serves as a record of services personally performed by Lurline Del, MD. It was created on his behalf by Wilburn Mylar, a trained medical scribe. The creation of this record is based on the scribe's personal observations and the provider's statements to them.   I, Lurline Del MD, have reviewed the above documentation for accuracy and completeness, and I agree with the above.   *Total Encounter Time as defined by the Centers for Medicare and Medicaid Services includes, in addition to the face-to-face time of a patient visit (documented in the note above) non-face-to-face time: obtaining and reviewing outside history, ordering and reviewing medications, tests or procedures, care coordination (communications with other health care professionals or caregivers) and documentation in the medical record.

## 2019-12-10 ENCOUNTER — Inpatient Hospital Stay: Payer: Medicare Other

## 2019-12-10 ENCOUNTER — Other Ambulatory Visit: Payer: Self-pay

## 2019-12-10 ENCOUNTER — Inpatient Hospital Stay: Payer: Medicare Other | Attending: Oncology | Admitting: Oncology

## 2019-12-10 VITALS — BP 149/81 | HR 81 | Temp 98.7°F | Resp 17 | Ht 63.0 in | Wt 174.7 lb

## 2019-12-10 DIAGNOSIS — M129 Arthropathy, unspecified: Secondary | ICD-10-CM | POA: Diagnosis not present

## 2019-12-10 DIAGNOSIS — Z170 Estrogen receptor positive status [ER+]: Secondary | ICD-10-CM

## 2019-12-10 DIAGNOSIS — C50312 Malignant neoplasm of lower-inner quadrant of left female breast: Secondary | ICD-10-CM | POA: Insufficient documentation

## 2019-12-10 DIAGNOSIS — Z7982 Long term (current) use of aspirin: Secondary | ICD-10-CM | POA: Diagnosis not present

## 2019-12-10 DIAGNOSIS — Z79899 Other long term (current) drug therapy: Secondary | ICD-10-CM | POA: Diagnosis not present

## 2019-12-10 DIAGNOSIS — L539 Erythematous condition, unspecified: Secondary | ICD-10-CM | POA: Diagnosis not present

## 2019-12-10 DIAGNOSIS — E7800 Pure hypercholesterolemia, unspecified: Secondary | ICD-10-CM | POA: Diagnosis not present

## 2019-12-10 DIAGNOSIS — E119 Type 2 diabetes mellitus without complications: Secondary | ICD-10-CM | POA: Diagnosis not present

## 2019-12-10 DIAGNOSIS — I10 Essential (primary) hypertension: Secondary | ICD-10-CM | POA: Diagnosis not present

## 2019-12-10 DIAGNOSIS — K219 Gastro-esophageal reflux disease without esophagitis: Secondary | ICD-10-CM | POA: Diagnosis not present

## 2019-12-10 DIAGNOSIS — R21 Rash and other nonspecific skin eruption: Secondary | ICD-10-CM | POA: Diagnosis not present

## 2019-12-10 LAB — CBC WITH DIFFERENTIAL/PLATELET
Abs Immature Granulocytes: 0.01 10*3/uL (ref 0.00–0.07)
Basophils Absolute: 0 10*3/uL (ref 0.0–0.1)
Basophils Relative: 1 %
Eosinophils Absolute: 0.3 10*3/uL (ref 0.0–0.5)
Eosinophils Relative: 5 %
HCT: 40.3 % (ref 36.0–46.0)
Hemoglobin: 13.4 g/dL (ref 12.0–15.0)
Immature Granulocytes: 0 %
Lymphocytes Relative: 20 %
Lymphs Abs: 1.4 10*3/uL (ref 0.7–4.0)
MCH: 29.8 pg (ref 26.0–34.0)
MCHC: 33.3 g/dL (ref 30.0–36.0)
MCV: 89.8 fL (ref 80.0–100.0)
Monocytes Absolute: 0.6 10*3/uL (ref 0.1–1.0)
Monocytes Relative: 9 %
Neutro Abs: 4.4 10*3/uL (ref 1.7–7.7)
Neutrophils Relative %: 65 %
Platelets: 192 10*3/uL (ref 150–400)
RBC: 4.49 MIL/uL (ref 3.87–5.11)
RDW: 12.8 % (ref 11.5–15.5)
WBC: 6.7 10*3/uL (ref 4.0–10.5)
nRBC: 0 % (ref 0.0–0.2)

## 2019-12-10 LAB — COMPREHENSIVE METABOLIC PANEL
ALT: 16 U/L (ref 0–44)
AST: 19 U/L (ref 15–41)
Albumin: 3.7 g/dL (ref 3.5–5.0)
Alkaline Phosphatase: 43 U/L (ref 38–126)
Anion gap: 6 (ref 5–15)
BUN: 15 mg/dL (ref 8–23)
CO2: 28 mmol/L (ref 22–32)
Calcium: 9.7 mg/dL (ref 8.9–10.3)
Chloride: 106 mmol/L (ref 98–111)
Creatinine, Ser: 0.99 mg/dL (ref 0.44–1.00)
GFR calc Af Amer: >60 mL/min (ref >60)
GFR calc non Af Amer: 54 mL/min — ABNORMAL LOW (ref >60)
Glucose, Bld: 99 mg/dL (ref 70–99)
Potassium: 4.4 mmol/L (ref 3.5–5.1)
Sodium: 140 mmol/L (ref 135–145)
Total Bilirubin: 0.3 mg/dL (ref 0.3–1.2)
Total Protein: 7.2 g/dL (ref 6.5–8.1)

## 2019-12-10 MED ORDER — DICLOFENAC SODIUM 50 MG PO TBEC: 50.0000 mg | DELAYED_RELEASE_TABLET | Freq: Two times a day (BID) | ORAL | 1 refills | Status: DC | PRN

## 2019-12-10 MED ORDER — ANASTROZOLE 1 MG PO TABS: 1.0000 mg | ORAL_TABLET | Freq: Every day | ORAL | 4 refills | Status: DC

## 2019-12-11 ENCOUNTER — Telehealth: Payer: Self-pay | Admitting: Oncology

## 2019-12-11 NOTE — Telephone Encounter (Signed)
Scheduled appts per 6/10 los. Pt confirmed appt dates and times.

## 2020-01-11 NOTE — Progress Notes (Incomplete)
  Patient Name: Jacqueline Stephenson MRN: [id number] DOB: [birth year] Referring Physician: Gaynelle Arabian (Profile Not Attached) Date of Service: 12/08/2019 [facility], [place]                                                        End Of Treatment Note  Diagnoses: C50.312-Malignant neoplasm of lower-inner quadrant of left female breast  Cancer Staging: Clinical stageIA(pT1c, pNX)LeftBreast LOQ,InvasiveLobularCarcinoma, ER+/ PR+/ Her2-, Grade1  Intent: Curative  Radiation Treatment Dates: 11/09/2019 through 12/08/2019 Site Technique Total Dose (Gy) Dose per Fx (Gy) Completed Fx Beam Energies  Breast, Left: Breast_Lt 3D 40.05/40.05 2.67 15/15 6X, 10X  Breast, Left: Breast_Lt_Bst 3D 12/12 2 6/6 6X, 10X   Narrative: The patient tolerated radiation therapy relatively well. She denied fatigue until the end of treatment. She also reported some severe episodes of constipation and some aching pains in the left breast towards the end of treatment. Throughout treatment, the left breast area was noted to have some slight erythema and darkening of the skin but there was no moist desquamation.  Plan: The patient will follow-up with radiation oncology in one month.  ________________________________________________   Blair Promise, PhD, MD  This document serves as a record of services personally performed by Gery Pray, MD. It was created on his behalf by Clerance Lav, a trained medical scribe. The creation of this record is based on the scribe's personal observations and the provider's statements to them. This document has been checked and approved by the attending provider.

## 2020-01-12 DIAGNOSIS — M25519 Pain in unspecified shoulder: Secondary | ICD-10-CM | POA: Diagnosis not present

## 2020-01-12 DIAGNOSIS — R1310 Dysphagia, unspecified: Secondary | ICD-10-CM | POA: Diagnosis not present

## 2020-01-13 NOTE — Progress Notes (Signed)
Radiation Oncology         [phone]) [phone] ________________________________  Name: Jacqueline Stephenson MRN: [id number]  Date: 01/14/2020  DOB: [birth year]  Follow-Up Visit Note  CC: Gaynelle Arabian, MD  Gaynelle Arabian, MD    ICD-10-CM   1. Malignant neoplasm of lower-inner quadrant of left breast in female, estrogen receptor positive ([place])  C50.312    Z17.0     Diagnosis: Clinical stageIA(pT1c, pNX)LeftBreast LOQ,InvasiveLobularCarcinoma, ER+/ PR+/ Her2-, Grade1  Interval Since Last Radiation: One month and one week.  Radiation Treatment Dates: 11/09/2019 through 12/08/2019 Site Technique Total Dose (Gy) Dose per Fx (Gy) Completed Fx Beam Energies  Breast, Left: Breast_Lt 3D 40.05/40.05 2.67 15/15 6X, 10X  Breast, Left: Breast_Lt_Bst 3D 12/12 2 6/6 6X, 10X    Narrative:  The patient returns today for routine follow-up. Since the end of treatment, she was seen by Dr. Jana Hakim on 12/10/2019. She continues on Anastrozole and is tolerating it well.  On review of systems, she reports an occasional shooting pain in her left breast in addition to pain in her left shoulder. She denies all other symptoms.  She denies any swelling in her left arm or hand.  She reports that her cortisone injection in the left shoulder is wearing off.  She will see Dr. Onnie Graham tomorrow for further evaluation of this issue.   ALLERGIES:  has No Known Allergies.  Meds: Current Outpatient Medications  Medication Sig Dispense Refill  . anastrozole (ARIMIDEX) 1 MG tablet Take 1 tablet (1 mg total) by mouth daily. 90 tablet 4  . aspirin EC 81 MG tablet Take 81 mg by mouth daily.    . calcium carbonate (TUMS - DOSED IN MG ELEMENTAL CALCIUM) 500 MG chewable tablet Chew 2-4 tablets by mouth as needed for indigestion or heartburn.    . cholecalciferol (VITAMIN D) 1000 units tablet Take 2,000 Units by mouth daily.    . diclofenac (VOLTAREN) 50 MG EC tablet Take 1 tablet (50 mg total) by mouth 2 (two) times daily  as needed. 60 tablet 1  . diclofenac Sodium (VOLTAREN) 1 % GEL Voltaren 1 % topical gel  APPLY 2 GRAM TO THE AFFECTED AREA(S) BY TOPICAL ROUTE 4 TIMES PER DAY    . famotidine (PEPCID) 20 MG tablet Take 20 mg by mouth 2 (two) times daily.    . fluorouracil (EFUDEX) 5 % cream Apply 1 application topically 2 (two) times daily.    Marland Kitchen lisinopril (PRINIVIL,ZESTRIL) 20 MG tablet Take 20 mg by mouth daily.     . Multiple Vitamin (MULTIVITAMIN WITH MINERALS) TABS tablet Take 1 tablet by mouth daily.    . rosuvastatin (CRESTOR) 10 MG tablet Take 10 mg by mouth every evening.  0  . tolterodine (DETROL LA) 4 MG 24 hr capsule Take 4 mg by mouth daily.    . vitamin E 100 UNIT capsule Take 100 Units by mouth daily.     No current facility-administered medications for this encounter.    Physical Findings: The patient is in no acute distress. Patient is alert and oriented.  height is _0  (1.6 m) and weight is 173 lb 9.6 oz (78.7 kg). Her temperature is 98.2 F (36.8 C). Her blood pressure is 168/67 (abnormal) and her pulse is 83. Her respiration is 20 and oxygen saturation is 97%.    Lungs are clear to auscultation bilaterally. Heart has regular rate and rhythm. No palpable cervical, supraclavicular, or axillary adenopathy. Abdomen soft, non-tender, normal bowel sounds. Right breast: No palpable mass,  nipple discharge, or bleeding.  Left breast: Skin is well-healed.  Minimal hyperpigmentation changes.  Very minimal edema.  No dominant mass appreciated in the breast.  Lumpectomy scar well-healed.  Lab Findings: Lab Results  Component Value Date   WBC 6.7 12/10/2019   HGB 13.4 12/10/2019   HCT 40.3 12/10/2019   MCV 89.8 12/10/2019   PLT 192 12/10/2019    Radiographic Findings: No results found.  Impression: Clinical stageIA(pT1c, pNX)LeftBreast LOQ,InvasiveLobularCarcinoma, ER+/ PR+/ Her2-, Grade1  The patient tolerated her radiation therapy quite well.  No residual issues at this  time  Plan: The patient is scheduled to follow-up with Dr. Jana Hakim on 02/17/2020.  As needed follow-up in radiation oncology.  As above the patient will see orthopedic surgery tomorrow for bilateral chronic shoulder pain.    ____________________________________   Blair Promise, PhD, MD  This document serves as a record of services personally performed by Gery Pray, MD. It was created on his behalf by Clerance Lav, a trained medical scribe. The creation of this record is based on the scribe's personal observations and the provider's statements to them. This document has been checked and approved by the attending provider.

## 2020-01-14 ENCOUNTER — Other Ambulatory Visit: Payer: Self-pay

## 2020-01-14 ENCOUNTER — Ambulatory Visit
Admission: RE | Admit: 2020-01-14 | Discharge: 2020-01-14 | Disposition: A | Discharge status: Home / Self Care | Payer: Medicare Other | Source: Ambulatory Visit | Attending: Radiation Oncology | Admitting: Radiation Oncology

## 2020-01-14 ENCOUNTER — Encounter: Payer: Self-pay | Admitting: Radiation Oncology

## 2020-01-14 DIAGNOSIS — Z79899 Other long term (current) drug therapy: Secondary | ICD-10-CM | POA: Diagnosis not present

## 2020-01-14 DIAGNOSIS — Z923 Personal history of irradiation: Secondary | ICD-10-CM | POA: Insufficient documentation

## 2020-01-14 DIAGNOSIS — C50312 Malignant neoplasm of lower-inner quadrant of left female breast: Secondary | ICD-10-CM | POA: Insufficient documentation

## 2020-01-14 DIAGNOSIS — Z170 Estrogen receptor positive status [ER+]: Secondary | ICD-10-CM | POA: Diagnosis not present

## 2020-01-14 DIAGNOSIS — Z79811 Long term (current) use of aromatase inhibitors: Secondary | ICD-10-CM | POA: Diagnosis not present

## 2020-01-14 DIAGNOSIS — M25512 Pain in left shoulder: Secondary | ICD-10-CM | POA: Diagnosis not present

## 2020-01-14 NOTE — Progress Notes (Signed)
Patient here for a 1 month f/u visit with  Dr. Sondra Come. Patient reports an occasional shooting pain in her left breast. Has pain in left shoulder is a "6" as the Cortisone is wearing off. Denies any other problems.  BP (!) 168/67 (BP Location: Right Arm, Patient Position: Sitting, Cuff Size: Large)   Pulse 83   Temp 98.2 F (36.8 C)   Resp 20   Ht 5\' 3"  (1.6 m)   Wt 173 lb 9.6 oz (78.7 kg)   SpO2 97%   BMI 30.75 kg/m   Wt Readings from Last 3 Encounters:  01/14/20 173 lb 9.6 oz (78.7 kg)  12/10/19 174 lb 11.2 oz (79.2 kg)  11/02/19 170 lb 12.8 oz (77.5 kg)

## 2020-01-15 DIAGNOSIS — M19012 Primary osteoarthritis, left shoulder: Secondary | ICD-10-CM | POA: Diagnosis not present

## 2020-01-15 DIAGNOSIS — M19011 Primary osteoarthritis, right shoulder: Secondary | ICD-10-CM | POA: Diagnosis not present

## 2020-02-08 ENCOUNTER — Other Ambulatory Visit: Payer: Self-pay | Admitting: Oncology

## 2020-02-15 NOTE — Patient Instructions (Addendum)
DUE TO COVID-19 ONLY ONE VISITOR IS ALLOWED TO COME WITH YOU AND STAY IN THE WAITING ROOM ONLY DURING PRE OP AND PROCEDURE.   IF YOU WILL BE ADMITTED INTO THE HOSPITAL YOU ARE ALLOWED ONE SUPPORT PERSON DURING VISITATION HOURS ONLY (10AM -8PM)   . The support person may change daily. . The support person must pass our screening, gel in and out, and wear a mask at all times, including in the patient's room. . Patients must also wear a mask when staff or their support person are in the room.   COVID SWAB TESTING MUST BE COMPLETED ON:  Friday, Aug. 20, 2021 at 12:05 PM   [address] [place], [place] [address]  (Must self quarantine after testing. Follow instructions on handout.)       Your procedure is scheduled on: Tuesday, Aug. 24, 2021   Report to [facility] Main  Entrance    Report to admitting at 7:15 AM   Call this number if you have problems the morning of surgery [phone]   Do not eat food :After Midnight.   May have liquids until 6:45 AM   day of surgery  CLEAR LIQUID DIET  Foods Allowed                                                                     Foods Excluded  Water, Black Coffee and tea, regular and decaf                             liquids that you cannot  Plain Jell-O in any flavor  (No red)                                           see through such as: Fruit ices (not with fruit pulp)                                     milk, soups, orange juice              Iced Popsicles (No red)                                    All solid food                                   Apple juices Sports drinks like Gatorade (No red) Lightly seasoned clear broth or consume(fat free) Sugar, honey syrup  Sample Menu Breakfast                                Lunch                                     Supper  Cranberry juice                    Beef broth                            Chicken broth Jell-O                                     Grape juice                            Apple juice Coffee or tea                        Jell-O                                      Popsicle                                                Coffee or tea                        Coffee or tea      Complete one G2 drink the morning of surgery at  6:45 AM     the day of surgery.   Oral Hygiene is also important to reduce your risk of infection.                                    Remember - BRUSH YOUR TEETH THE MORNING OF SURGERY WITH YOUR REGULAR TOOTHPASTE   Do NOT smoke after Midnight   Take these medicines the morning of surgery with A SIP OF WATER: Anastrazole  DO NOT TAKE ANY ORAL DIABETIC MEDICATIONS DAY OF YOUR SURGERY                               You may not have any metal on your body including hair pins, jewelry, and body piercings             Do not wear make-up, lotions, powders, perfumes/cologne, or deodorant             Do not wear nail polish.  Do not shave  48 hours prior to surgery.               Do not bring valuables to the hospital. [place].   Contacts, dentures or bridgework may not be worn into surgery.   Bring small overnight bag day of surgery.   Special Instructions: Bring a copy of your healthcare power of attorney and living will documents         the day of surgery if you haven't scanned them in before.              Please read over the following fact sheets you were given: IF YOU HAVE  QUESTIONS ABOUT YOUR PRE OP INSTRUCTIONS PLEASE CALL [phone]  [place]- Preparing for Total Shoulder Arthroplasty    Before surgery, you can play an important role. Because skin is not sterile, your skin needs to be as free of germs as possible. You can reduce the number of germs on your skin by using the following products. . Benzoyl Peroxide Gel o Reduces the number of germs present on the skin o Applied twice a day to shoulder area starting two days before surgery     ==================================================================  Please follow these instructions carefully:  BENZOYL PEROXIDE 5% GEL  Please do not use if you have an allergy to benzoyl peroxide.   If your skin becomes reddened/irritated stop using the benzoyl peroxide.  Starting two days before surgery, apply as follows: (Sunday and Monday) 1. Apply benzoyl peroxide in the morning and at night. Apply after taking a shower. If you are not taking a shower clean entire shoulder front, back, and side along with the armpit with a clean wet washcloth.  2. Place a quarter-sized dollop on your shoulder and rub in thoroughly, making sure to cover the front, back, and side of your shoulder, along with the armpit.   2 days before ____ AM   ____ PM              1 day before ____ AM   ____ PM                         3. Do this twice a day for two days.  (Last application is the night before surgery, AFTER using the CHG soap as described below).  4. Do NOT apply benzoyl peroxide gel on the day of surgery.   - Preparing for Surgery Before surgery, you can play an important role.  Because skin is not sterile, your skin needs to be as free of germs as possible.  You can reduce the number of germs on your skin by washing with CHG (chlorahexidine gluconate) soap before surgery.  CHG is an antiseptic cleaner which kills germs and bonds with the skin to continue killing germs even after washing. Please DO NOT use if you have an allergy to CHG or antibacterial soaps.  If your skin becomes reddened/irritated stop using the CHG and inform your nurse when you arrive at Short Stay. Do not shave (including legs and underarms) for at least 48 hours prior to the first CHG shower.  You may shave your face/neck.  Please follow these instructions carefully:  1.  Shower with CHG Soap the night before surgery and the  morning of surgery.  2.  If you choose to wash your hair, wash your hair first as usual  with your normal  shampoo.  3.  After you shampoo, rinse your hair and body thoroughly to remove the shampoo.                             4.  Use CHG as you would any other liquid soap.  You can apply chg directly to the skin and wash.  Gently with a scrungie or clean washcloth.  5.  Apply the CHG Soap to your body ONLY FROM THE NECK DOWN.   Do   not use on face/ open  Wound or open sores. Avoid contact with eyes, ears mouth and   genitals (private parts).                       Wash face,  Genitals (private parts) with your normal soap.             6.  Wash thoroughly, paying special attention to the area where your    surgery  will be performed.  7.  Thoroughly rinse your body with warm water from the neck down.  8.  DO NOT shower/wash with your normal soap after using and rinsing off the CHG Soap.                9.  Pat yourself dry with a clean towel.            10.  Wear clean pajamas.            11.  Place clean sheets on your bed the night of your first shower and do not  sleep with pets. Day of Surgery : Do not apply any lotions/deodorants the morning of surgery.  Please wear clean clothes to the hospital/surgery center.  FAILURE TO FOLLOW THESE INSTRUCTIONS MAY RESULT IN THE CANCELLATION OF YOUR SURGERY  PATIENT SIGNATURE_________________________________  NURSE SIGNATURE__________________________________  ________________________________________________________________________   Jacqueline Stephenson  An incentive spirometer is a tool that can help keep your lungs clear and active. This tool measures how well you are filling your lungs with each breath. Taking long deep breaths may help reverse or decrease the chance of developing breathing (pulmonary) problems (especially infection) following:  A long period of time when you are unable to move or be active. BEFORE THE PROCEDURE   If the spirometer includes an indicator to show your best effort, your  nurse or respiratory therapist will set it to a desired goal.  If possible, sit up straight or lean slightly forward. Try not to slouch.  Hold the incentive spirometer in an upright position. INSTRUCTIONS FOR USE  1. Sit on the edge of your bed if possible, or sit up as far as you can in bed or on a chair. 2. Hold the incentive spirometer in an upright position. 3. Breathe out normally. 4. Place the mouthpiece in your mouth and seal your lips tightly around it. 5. Breathe in slowly and as deeply as possible, raising the piston or the ball toward the top of the column. 6. Hold your breath for 3-5 seconds or for as long as possible. Allow the piston or ball to fall to the bottom of the column. 7. Remove the mouthpiece from your mouth and breathe out normally. 8. Rest for a few seconds and repeat Steps 1 through 7 at least 10 times every 1-2 hours when you are awake. Take your time and take a few normal breaths between deep breaths. 9. The spirometer may include an indicator to show your best effort. Use the indicator as a goal to work toward during each repetition. 10. After each set of 10 deep breaths, practice coughing to be sure your lungs are clear. If you have an incision (the cut made at the time of surgery), support your incision when coughing by placing a pillow or rolled up towels firmly against it. Once you are able to get out of bed, walk around indoors and cough well. You may stop using the incentive spirometer when instructed by your caregiver.  RISKS AND COMPLICATIONS  Take your time  so you do not get dizzy or light-headed.  If you are in pain, you may need to take or ask for pain medication before doing incentive spirometry. It is harder to take a deep breath if you are having pain. AFTER USE  Rest and breathe slowly and easily.  It can be helpful to keep track of a log of your progress. Your caregiver can provide you with a simple table to help with this. If you are using the  spirometer at home, follow these instructions: [place] IF:   You are having difficultly using the spirometer.  You have trouble using the spirometer as often as instructed.  Your pain medication is not giving enough relief while using the spirometer.  You develop fever of 100.5 F (38.1 C) or higher. SEEK IMMEDIATE MEDICAL CARE IF:   You cough up bloody sputum that had not been present before.  You develop fever of 102 F (38.9 C) or greater.  You develop worsening pain at or near the incision site. MAKE SURE YOU:   Understand these instructions.  Will watch your condition.  Will get help right away if you are not doing well or get worse. Document Released: 10/29/2006 Document Revised: 09/10/2011 Document Reviewed: 12/30/2006 Exit[employer] Patient Information 2014 Exit[employer], [place].   ________________________________________________________________________ How to Manage Your Diabetes Before and After Surgery  Why is it important to control my blood sugar before and after surgery? . Improving blood sugar levels before and after surgery helps healing and can limit problems. . A way of improving blood sugar control is eating a healthy diet by: o  Eating less sugar and carbohydrates o  Increasing activity/exercise o  Talking with your doctor about reaching your blood sugar goals . High blood sugars (greater than 180 mg/dL) can raise your risk of infections and slow your recovery, so you will need to focus on controlling your diabetes during the weeks before surgery. . Make sure that the doctor who takes care of your diabetes knows about your planned surgery including the date and location.  How do I manage my blood sugar before surgery? . Check your blood sugar at least 4 times a day, starting 2 days before surgery, to make sure that the level is not too high or low. o Check your blood sugar the morning of your surgery when you wake up and every 2 hours until you get to  the Short Stay unit. . If your blood sugar is less than 70 mg/dL, you will need to treat for low blood sugar: o Do not take insulin. o Treat a low blood sugar (less than 70 mg/dL) with  cup of clear juice (cranberry or apple), 4 glucose tablets, OR glucose gel. o Recheck blood sugar in 15 minutes after treatment (to make sure it is greater than 70 mg/dL). If your blood sugar is not greater than 70 mg/dL on recheck, call [phone] for further instructions. . Report your blood sugar to the short stay nurse when you get to Short Stay.  . If you are admitted to the hospital after surgery: o Your blood sugar will be checked by the staff and you will probably be given insulin after surgery (instead of oral diabetes medicines) to make sure you have good blood sugar levels. o The goal for blood sugar control after surgery is 80-180 mg/dL.   WHAT DO I DO ABOUT MY DIABETES MEDICATION?  Marland Kitchen Do not take oral diabetes medicines (pills) the morning of surgery.  Marland Kitchen THE  NIGHT BEFORE SURGERY, take     units of       insulin.       . THE MORNING OF SURGERY, take   units of         insulin.  . The day of surgery, do not take other diabetes injectables, including Byetta (exenatide), Bydureon (exenatide ER), Victoza (liraglutide), or Trulicity (dulaglutide).  . If your CBG is greater than 220 mg/dL, you may take  of your sliding scale  . (correction) dose of insulin.    For patients with insulin pumps: Contact your diabetes doctor for specific instructions before surgery. Decrease basal rates by 20% at midnight the night before your surgery. Note that if your surgery is planned to be longer than 2 hours, your insulin pump will be removed and intravenous (IV) insulin will be started and managed by the nurses and the anesthesiologist. You will be able to restart your insulin pump once you are awake and able to manage it.  Make sure to bring insulin pump supplies to the hospital with you in case the  site  needs to be changed.  Patient Signature:  Date:   Nurse Signature:  Date:   Reviewed and Endorsed by [facility] Patient Education Committee, August 2015

## 2020-02-15 NOTE — Progress Notes (Addendum)
COVID Vaccine Completed: Yes Date COVID Vaccine completed: 07/24/19, 08/11/19 COVID vaccine manufacturer: Pfizer      PCP - Dr. Oneida Arenas Cardiologist - N/A  Chest x-ray - greater than 1 year EKG - 09/02/19 in epic Stress Test - greater than 2 years ECHO - N/A Cardiac Cath -  N/A  Sleep Study - N/A CPAP - N/A  Fasting Blood Sugar - 110-115 Checks Blood Sugar ___1__ times a week   Blood Thinner Instructions: N/A Aspirin Instructions: Will call Dr. Onnie Graham for instruction Last Dose:  Anesthesia review: N/A  Patient denies shortness of breath, fever, cough and chest pain at PAT appointment   Patient verbalized understanding of instructions that were given to them at the PAT appointment. Patient was also instructed that they will need to review over the PAT instructions again at home before surgery.

## 2020-02-16 ENCOUNTER — Encounter (HOSPITAL_COMMUNITY)
Admission: RE | Admit: 2020-02-16 | Discharge: 2020-02-16 | Disposition: A | Discharge status: Home / Self Care | Payer: Medicare Other | Source: Ambulatory Visit | Attending: Orthopedic Surgery | Admitting: Orthopedic Surgery

## 2020-02-16 ENCOUNTER — Other Ambulatory Visit: Payer: Self-pay

## 2020-02-16 ENCOUNTER — Encounter (HOSPITAL_COMMUNITY): Payer: Self-pay

## 2020-02-16 DIAGNOSIS — Z01812 Encounter for preprocedural laboratory examination: Secondary | ICD-10-CM | POA: Diagnosis not present

## 2020-02-16 HISTORY — DX: Cardiac murmur, unspecified: R01.1

## 2020-02-16 HISTORY — DX: Unspecified cataract: H26.9

## 2020-02-16 LAB — CBC
HCT: 43.1 % (ref 36.0–46.0)
Hemoglobin: 14.4 g/dL (ref 12.0–15.0)
MCH: 30.4 pg (ref 26.0–34.0)
MCHC: 33.4 g/dL (ref 30.0–36.0)
MCV: 90.9 fL (ref 80.0–100.0)
Platelets: 240 10*3/uL (ref 150–400)
RBC: 4.74 MIL/uL (ref 3.87–5.11)
RDW: 12.6 % (ref 11.5–15.5)
WBC: 7 10*3/uL (ref 4.0–10.5)
nRBC: 0 % (ref 0.0–0.2)

## 2020-02-16 LAB — BASIC METABOLIC PANEL
Anion gap: 10 (ref 5–15)
BUN: 19 mg/dL (ref 8–23)
CO2: 28 mmol/L (ref 22–32)
Calcium: 10.2 mg/dL (ref 8.9–10.3)
Chloride: 102 mmol/L (ref 98–111)
Creatinine, Ser: 0.99 mg/dL (ref 0.44–1.00)
GFR calc Af Amer: >60 mL/min (ref >60)
GFR calc non Af Amer: 54 mL/min — ABNORMAL LOW (ref >60)
Glucose, Bld: 120 mg/dL — ABNORMAL HIGH (ref 70–99)
Potassium: 4.8 mmol/L (ref 3.5–5.1)
Sodium: 140 mmol/L (ref 135–145)

## 2020-02-16 LAB — SURGICAL PCR SCREEN
MRSA, PCR: NEGATIVE
Staphylococcus aureus: POSITIVE — AB

## 2020-02-16 LAB — HEMOGLOBIN A1C
Hgb A1c MFr Bld: 6.2 % — ABNORMAL HIGH (ref 4.8–5.6)
Mean Plasma Glucose: 131.24 mg/dL

## 2020-02-16 NOTE — Progress Notes (Signed)
[place]  [phone] [phone]     ID: Jacqueline Stephenson DOB: [birth year]  MR#: [id number]  ZTI#:[id number]  Patient Care Team: Gaynelle Arabian, MD as PCP - General (Family Medicine) Erroll Luna, MD as Consulting Physician (General Surgery) Arihant Pennings, Virgie Dad, MD as Consulting Physician (Oncology) Marylynn Pearson, MD as Consulting Physician (Obstetrics and Gynecology) Gery Pray, MD as Consulting Physician (Radiation Oncology) Mauro Kaufmann, RN as Oncology Nurse Navigator Rockwell Germany, RN as Oncology Nurse Navigator Justice Britain, MD as Consulting Physician (Orthopedic Surgery) Chauncey Cruel, MD OTHER MD:   CHIEF COMPLAINT: Estrogen receptor positive lobular breast cancer  CURRENT TREATMENT: Anastrozole   INTERVAL HISTORY: Jacqueline Stephenson was 5 was scheduled for a virtual visit today but she came in person, accompanied by her husband.    She continues on anastrozole for her lobular breast cancer, with good tolerance.  Hot flashes and vaginal dryness are not issues for her.  She obtains the drug at a very good price  She is scheduled for reverse shoulder arthroplasty on 02/23/2020 under Dr. Onnie Graham.  She tells me and the anesthesiologist relative told her she should have the right shoulder done first because the left shoulder was so recently irradiated.   REVIEW OF SYSTEMS: Jacqueline Stephenson is very concerned about the upcoming shoulder surgery and is anxious about it.  She wonders if she should take Tylenol for pain or aspirin.  In general though a detailed review of systems today was stable   HISTORY OF CURRENT ILLNESS: From the original intake note:  Jacqueline Stephenson presented for her annual mammogram at [facility] on 07/30/2019 with left nipple inversion. She denied palpating any masses. She underwent bilateral diagnostic mammography with tomography and left breast ultrasonography showing: breast density category B; suspicious  0.9 cm mass at 6-7 o'clock in the left breast; 3 mm benign cyst in the outer retroareolar left breast at 3 o'clock; left axilla negative for lymphadenopathy.  Accordingly on 08/06/2019 she proceeded to biopsy of the left breast area in question. The pathology from this procedure ([id number]) showed: invasive mammary carcinoma, grade 2, e-cadherin negative. Prognostic indicators significant for: estrogen receptor, 95% positive and progesterone receptor, 30% positive, both with strong staining intensity. Proliferation marker Ki67 at 5%. HER2 equivocal by immunohistochemistry (2+), but negative by fluorescent in situ hybridization with a signals ratio 1.4 and number per cell 1.75.  She underwent breast MRI yesterday, 08/24/2019, which showed: No suspicious masses in the right breast no abnormal appearing lymph nodes complex mass in the lower outer left breast which is postbiopsy change and hematoma primarily.  She has an appointment with Dr. Sondra Come 08/26/2019.  She is scheduled for left lumpectomy on 09/08/2019 under Dr. Brantley Stage.  The patient's subsequent history is as detailed below.   PAST MEDICAL HISTORY: Past Medical History:  Diagnosis Date  . Arthritis   . Bladder spasms   . Breast cancer ([facility]) 2021   Left  . Cataract    Left  . Diabetes mellitus without complication ([facility])    Diet controlled  . GERD (gastroesophageal reflux disease)   . Heart murmur    in childhood, no problems  . High cholesterol   . Hypertension   . Shingles   . Vertigo     PAST SURGICAL HISTORY: Past Surgical History:  Procedure Laterality Date  . BREAST EXCISIONAL BIOPSY    . BREAST LUMPECTOMY WITH RADIOACTIVE SEED LOCALIZATION Left 09/08/2019   Procedure: LEFT BREAST LUMPECTOMY WITH RADIOACTIVE SEED LOCALIZATION;  Surgeon: Erroll Luna, MD;  Location: [facility];  Service: General;  Laterality: Left;  . CATARACT EXTRACTION W/ INTRAOCULAR LENS IMPLANT Right   . COLONOSCOPY    . TRIGGER FINGER  RELEASE Left     FAMILY HISTORY: Family History  Problem Relation Age of Onset  . Heart disease Mother   . Heart disease Father    Ethal's father died in his late 24s from heart disease.  He was a smoker.  The patient's mother died at age 76 also from heart disease.  She was not a smoker.  There is no breast ovarian pancreatic or prostate cancer in the family to the patient's knowledge.  The patient has no brothers or sisters.   GYNECOLOGIC HISTORY:  No LMP recorded. Patient is postmenopausal. Menarche: 79 years old Age at first live birth: 79 years old [place] P 1 LMP late 83s Contraceptive: Oral contraceptives less than 1 year HRT no  Hysterectomy? no BSO?  No   SOCIAL HISTORY: (updated 08/2019)  Jacqueline Stephenson worked at [employer] and light as did her husband Risk manager.  He is a former [employer] fast streamer.  They are both retired.  They have been married more than 50 years.  Their child Jacqueline Stephenson, 80 years old, lives in [place] and works usually for [employer]  Jacqueline Stephenson has a child from an earlier marriage.  The patient has 2 grandchildren.  She attends a local [employer] DIRECTIVES: In the absence of any documentation to the contrary, the patient's spouse is their HCPOA.    HEALTH MAINTENANCE: Social History   Tobacco Use  . Smoking status: Never Smoker  . Smokeless tobacco: Never Used  Vaping Use  . Vaping Use: Never used  Substance Use Topics  . Alcohol use: Yes    Comment: Socially, rare  . Drug use: Never     Colonoscopy: [facility]  PAP: 2021  Bone density: "Normal" per patient   No Known Allergies  Current Outpatient Medications  Medication Sig Dispense Refill  . acetaminophen (TYLENOL) 500 MG tablet Take 500 mg by mouth at bedtime.    Marland Kitchen anastrozole (ARIMIDEX) 1 MG tablet Take 1 tablet (1 mg total) by mouth daily. 90 tablet 4  . aspirin EC 81 MG tablet Take 81 mg by mouth daily.    . calcium carbonate (TUMS - DOSED IN MG ELEMENTAL CALCIUM) 500 MG chewable  tablet Chew 2-4 tablets by mouth as needed for indigestion or heartburn.    . Cholecalciferol (VITAMIN D) 50 MCG (2000 UT) tablet Take 2,000 Units by mouth daily.     . diclofenac (VOLTAREN) 50 MG EC tablet Take 1 tablet (50 mg total) by mouth 2 (two) times daily as needed. 60 tablet 1  . diclofenac Sodium (VOLTAREN) 1 % GEL Apply 2 g topically daily as needed (pain).     . famotidine (PEPCID) 20 MG tablet Take 20 mg by mouth 2 (two) times daily. (Patient not taking: Reported on 02/12/2020)    . fluorouracil (EFUDEX) 5 % cream Apply 1 application topically 2 (two) times daily. (Patient not taking: Reported on 02/12/2020)    . lisinopril (PRINIVIL,ZESTRIL) 20 MG tablet Take 20 mg by mouth daily.     . Multiple Vitamin (MULTIVITAMIN WITH MINERALS) TABS tablet Take 1 tablet by mouth daily.    . rosuvastatin (CRESTOR) 10 MG tablet Take 10 mg by mouth every evening.  0  . tolterodine (DETROL LA) 4 MG 24 hr capsule Take 4 mg by  mouth daily.    . vitamin E 100 UNIT capsule Take 100 Units by mouth daily. (Patient not taking: Reported on 02/12/2020)     No current facility-administered medications for this visit.    OBJECTIVE:  white woman in no acute distress  Vitals:   02/17/20 1147  BP: (!) 144/66  Pulse: 77  Resp: 18  Temp: 98.3 F (36.8 C)  SpO2: 95%     Body mass index is 25.91 kg/m.   Wt Readings from Last 3 Encounters:  02/17/20 170 lb 7 oz (77.3 kg)  02/16/20 227 lb 2 oz (103 kg)  01/14/20 173 lb 9.6 oz (78.7 kg)      ECOG FS:1 - Symptomatic but completely ambulatory  Sclerae unicteric, EOMs intact Wearing a mask No cervical or supraclavicular adenopathy Lungs no rales or rhonchi Heart regular rate and rhythm Abd soft, nontender, positive bowel sounds MSK no focal spinal tenderness, no upper extremity lymphedema Neuro: nonfocal, well oriented, appropriate affect Breasts: The right breast is unremarkable.  The left breast is status post lumpectomy and radiation with no residual  erythema and no evidence of local recurrence.  Both axillae are benign.  LAB RESULTS:  CMP     Component Value Date/Time   NA 140 02/16/2020 1444   K 4.8 02/16/2020 1444   CL 102 02/16/2020 1444   CO2 28 02/16/2020 1444   GLUCOSE 120 (H) 02/16/2020 1444   BUN 19 02/16/2020 1444   CREATININE 0.99 02/16/2020 1444   CREATININE 1.08 (H) 08/25/2019 1524   CALCIUM 10.2 02/16/2020 1444   PROT 7.2 12/10/2019 1427   ALBUMIN 3.7 12/10/2019 1427   AST 19 12/10/2019 1427   AST 19 08/25/2019 1524   ALT 16 12/10/2019 1427   ALT 18 08/25/2019 1524   ALKPHOS 43 12/10/2019 1427   BILITOT 0.3 12/10/2019 1427   BILITOT 0.4 08/25/2019 1524   GFRNONAA 54 (L) 02/16/2020 1444   GFRNONAA 49 (L) 08/25/2019 1524   GFRAA >60 02/16/2020 1444   GFRAA 57 (L) 08/25/2019 1524    No results found for: TOTALPROTELP, ALBUMINELP, A1GS, A2GS, BETS, BETA2SER, GAMS, MSPIKE, SPEI  Lab Results  Component Value Date   WBC 7.0 02/16/2020   NEUTROABS 4.4 12/10/2019   HGB 14.4 02/16/2020   HCT 43.1 02/16/2020   MCV 90.9 02/16/2020   PLT 240 02/16/2020    No results found for: LABCA2  No components found for: WLNLGX211  No results for input(s): INR in the last 168 hours.  No results found for: LABCA2  No results found for: HER740  No results found for: CXK481  No results found for: EHU314  No results found for: CA2729  No components found for: HGQUANT  No results found for: CEA1 / No results found for: CEA1   No results found for: AFPTUMOR  No results found for: CHROMOGRNA  No results found for: KPAFRELGTCHN, LAMBDASER, KAPLAMBRATIO (kappa/lambda light chains)  No results found for: HGBA, HGBA2QUANT, HGBFQUANT, HGBSQUAN (Hemoglobinopathy evaluation)   No results found for: LDH  No results found for: IRON, TIBC, IRONPCTSAT (Iron and TIBC)  No results found for: FERRITIN  Urinalysis    Component Value Date/Time   COLORURINE YELLOW 12/02/2017 [place] 12/02/2017  1243   LABSPEC 1.014 12/02/2017 1243   PHURINE 6.0 12/02/2017 [place] 12/02/2017 [place] 12/02/2017 1243   BILIRUBINUR NEGATIVE 12/02/2017 [place] 12/02/2017 1243   PROTEINUR NEGATIVE 12/02/2017 1243   NITRITE  NEGATIVE 12/02/2017 1243   LEUKOCYTESUR TRACE (A) 12/02/2017 1243    STUDIES: No results found.   ELIGIBLE FOR AVAILABLE RESEARCH PROTOCOL: AET  ASSESSMENT: 79 y.o. Jacqueline Stephenson woman status post left breast lower inner quadrant biopsy 08/06/2019 for a clinical T1b N0, stage IA invasive lobular carcinoma, E-cadherin negative, grade 2, estrogen and progesterone receptor positive, HER-2 not amplified, with an MIB-1 of 5%  (1) status post left lumpectomy with no sentinel lymph node sampling 09/08/2019 for a pT1c pNX, stage IA invasive lobular breast cancer, grade 1, with close but negative margins.  (2) adjuvant radiation 11/09/2019 through 12/08/2019 Site Technique Total Dose (Gy) Dose per Fx (Gy) Completed Fx Beam Energies  Breast, Left: Breast_Lt 3D 40.05/40.05 2.67 15/15 6X, 10X  Breast, Left: Breast_Lt_Bst 3D 12/12 2 6/6 6X, 10X    (3) started anastrozole 10/31/2019  (a) bone density January 2022   PLAN:  Cherly is doing well on anastrozole and the plan will be to continue that a total of 5 years.  I am scheduling her for DEXA scan at the same time as her next mammogram which will be January 2022.  That will serve as our new baseline.  I reassured her that taking a stool softener every 2 or 3 days for minimal constipation is not going to get her into any trouble.  She would like to be changed to the shoulder that is going to be operated on.  She was encouraged to discuss that with Dr. Onnie Graham.  I have gone ahead and sent him brief note alerting him to her question  Otherwise she will have mammography and a DEXA scan in January and see me in February.  She knows to call for any other issue that may develop before that  visit  Total encounter time 25 minutes.Chauncey Cruel, MD   02/17/2020 12:01 PM Medical Oncology and Hematology [facility] [place], [place] [address] Tel. [phone]    Fax. [phone]   This document serves as a record of services personally performed by Lurline Del, MD. It was created on his behalf by Wilburn Mylar, a trained medical scribe. The creation of this record is based on the scribe's personal observations and the provider's statements to them.   I, Lurline Del MD, have reviewed the above documentation for accuracy and completeness, and I agree with the above.   *Total Encounter Time as defined by the Centers for Medicare and Medicaid Services includes, in addition to the face-to-face time of a patient visit (documented in the note above) non-face-to-face time: obtaining and reviewing outside history, ordering and reviewing medications, tests or procedures, care coordination (communications with other health care professionals or caregivers) and documentation in the medical record.

## 2020-02-17 ENCOUNTER — Other Ambulatory Visit: Payer: Self-pay

## 2020-02-17 ENCOUNTER — Other Ambulatory Visit: Payer: Medicare Other

## 2020-02-17 ENCOUNTER — Inpatient Hospital Stay: Payer: Medicare Other | Attending: Oncology | Admitting: Oncology

## 2020-02-17 VITALS — BP 144/66 | HR 77 | Temp 98.3°F | Resp 18 | Wt 170.4 lb

## 2020-02-17 DIAGNOSIS — Z79811 Long term (current) use of aromatase inhibitors: Secondary | ICD-10-CM | POA: Diagnosis not present

## 2020-02-17 DIAGNOSIS — E119 Type 2 diabetes mellitus without complications: Secondary | ICD-10-CM | POA: Insufficient documentation

## 2020-02-17 DIAGNOSIS — C50312 Malignant neoplasm of lower-inner quadrant of left female breast: Secondary | ICD-10-CM | POA: Diagnosis not present

## 2020-02-17 DIAGNOSIS — K219 Gastro-esophageal reflux disease without esophagitis: Secondary | ICD-10-CM | POA: Diagnosis not present

## 2020-02-17 DIAGNOSIS — I10 Essential (primary) hypertension: Secondary | ICD-10-CM | POA: Diagnosis not present

## 2020-02-17 DIAGNOSIS — Z79899 Other long term (current) drug therapy: Secondary | ICD-10-CM | POA: Insufficient documentation

## 2020-02-17 DIAGNOSIS — M129 Arthropathy, unspecified: Secondary | ICD-10-CM | POA: Insufficient documentation

## 2020-02-17 DIAGNOSIS — Z170 Estrogen receptor positive status [ER+]: Secondary | ICD-10-CM | POA: Insufficient documentation

## 2020-02-17 DIAGNOSIS — Z7982 Long term (current) use of aspirin: Secondary | ICD-10-CM | POA: Insufficient documentation

## 2020-02-17 DIAGNOSIS — E7800 Pure hypercholesterolemia, unspecified: Secondary | ICD-10-CM | POA: Insufficient documentation

## 2020-02-17 NOTE — Progress Notes (Signed)
PCR results 02/16/20 faxed to Dr. Onnie Graham via epic.

## 2020-02-19 ENCOUNTER — Other Ambulatory Visit (HOSPITAL_COMMUNITY)
Admission: RE | Admit: 2020-02-19 | Discharge: 2020-02-19 | Disposition: A | Discharge status: Home / Self Care | Payer: Medicare Other | Source: Ambulatory Visit | Attending: Orthopedic Surgery | Admitting: Orthopedic Surgery

## 2020-02-19 ENCOUNTER — Telehealth: Payer: Self-pay | Admitting: Oncology

## 2020-02-19 NOTE — Telephone Encounter (Signed)
Scheduled appts per 8/18 los. Pt confirmed appt date and time.

## 2020-02-22 ENCOUNTER — Other Ambulatory Visit (HOSPITAL_COMMUNITY)
Admission: RE | Admit: 2020-02-22 | Discharge: 2020-02-22 | Disposition: A | Discharge status: Home / Self Care | Payer: Medicare Other | Source: Ambulatory Visit | Attending: Orthopedic Surgery | Admitting: Orthopedic Surgery

## 2020-02-22 DIAGNOSIS — Z20822 Contact with and (suspected) exposure to covid-19: Secondary | ICD-10-CM | POA: Insufficient documentation

## 2020-02-22 DIAGNOSIS — Z01812 Encounter for preprocedural laboratory examination: Secondary | ICD-10-CM | POA: Insufficient documentation

## 2020-02-22 LAB — SARS CORONAVIRUS 2 (TAT 6-24 HRS): SARS Coronavirus 2: NEGATIVE

## 2020-02-23 ENCOUNTER — Ambulatory Visit (HOSPITAL_COMMUNITY): Payer: Medicare Other | Admitting: Anesthesiology

## 2020-02-23 ENCOUNTER — Other Ambulatory Visit: Payer: Self-pay | Admitting: Oncology

## 2020-02-23 ENCOUNTER — Ambulatory Visit (HOSPITAL_COMMUNITY)
Admission: RE | Admit: 2020-02-23 | Discharge: 2020-02-23 | Disposition: A | Discharge status: Home / Self Care | Payer: Medicare Other | Attending: Orthopedic Surgery | Admitting: Orthopedic Surgery

## 2020-02-23 ENCOUNTER — Other Ambulatory Visit: Payer: Self-pay

## 2020-02-23 ENCOUNTER — Encounter (HOSPITAL_COMMUNITY): Payer: Self-pay | Admitting: Orthopedic Surgery

## 2020-02-23 ENCOUNTER — Encounter (HOSPITAL_COMMUNITY)
Admission: RE | Disposition: A | Discharge status: Home / Self Care | Payer: Self-pay | Source: Home / Self Care | Attending: Orthopedic Surgery

## 2020-02-23 DIAGNOSIS — Z7982 Long term (current) use of aspirin: Secondary | ICD-10-CM | POA: Diagnosis not present

## 2020-02-23 DIAGNOSIS — G8918 Other acute postprocedural pain: Secondary | ICD-10-CM | POA: Diagnosis not present

## 2020-02-23 DIAGNOSIS — Z79811 Long term (current) use of aromatase inhibitors: Secondary | ICD-10-CM | POA: Diagnosis not present

## 2020-02-23 DIAGNOSIS — M19011 Primary osteoarthritis, right shoulder: Secondary | ICD-10-CM | POA: Diagnosis not present

## 2020-02-23 DIAGNOSIS — E7800 Pure hypercholesterolemia, unspecified: Secondary | ICD-10-CM | POA: Insufficient documentation

## 2020-02-23 DIAGNOSIS — E119 Type 2 diabetes mellitus without complications: Secondary | ICD-10-CM | POA: Diagnosis not present

## 2020-02-23 DIAGNOSIS — I10 Essential (primary) hypertension: Secondary | ICD-10-CM | POA: Insufficient documentation

## 2020-02-23 DIAGNOSIS — Z79899 Other long term (current) drug therapy: Secondary | ICD-10-CM | POA: Diagnosis not present

## 2020-02-23 DIAGNOSIS — Z853 Personal history of malignant neoplasm of breast: Secondary | ICD-10-CM | POA: Diagnosis not present

## 2020-02-23 DIAGNOSIS — K219 Gastro-esophageal reflux disease without esophagitis: Secondary | ICD-10-CM | POA: Insufficient documentation

## 2020-02-23 HISTORY — PX: REVERSE SHOULDER ARTHROPLASTY: SHX5054

## 2020-02-23 LAB — GLUCOSE, CAPILLARY: Glucose-Capillary: 122 mg/dL — ABNORMAL HIGH (ref 70–99)

## 2020-02-23 SURGERY — ARTHROPLASTY, SHOULDER, TOTAL, REVERSE
Anesthesia: General | Site: Shoulder | Laterality: Right

## 2020-02-23 MED ORDER — CYCLOBENZAPRINE HCL 10 MG PO TABS: 10.0000 mg | ORAL_TABLET | Freq: Three times a day (TID) | ORAL | 1 refills | Status: DC | PRN

## 2020-02-23 MED ORDER — ACETAMINOPHEN 10 MG/ML IV SOLN
1000.0000 mg | Freq: Once | INTRAVENOUS | Status: AC
  Administered 2020-02-23: 1000 mg via INTRAVENOUS

## 2020-02-23 MED ORDER — CEFAZOLIN SODIUM-DEXTROSE 2-4 GM/100ML-% IV SOLN
2.0000 g | INTRAVENOUS | Status: AC
  Administered 2020-02-23: 2 g via INTRAVENOUS
  Filled 2020-02-23: qty 100

## 2020-02-23 MED ORDER — ROCURONIUM BROMIDE 10 MG/ML (PF) SYRINGE
PREFILLED_SYRINGE | INTRAVENOUS | Status: DC | PRN
  Administered 2020-02-23: 50 mg via INTRAVENOUS

## 2020-02-23 MED ORDER — OXYCODONE HCL 5 MG PO TABS: 5.0000 mg | ORAL_TABLET | Freq: Once | ORAL | Status: DC | PRN

## 2020-02-23 MED ORDER — LACTATED RINGERS IV BOLUS: 250.0000 mL | Freq: Once | INTRAVENOUS | Status: DC

## 2020-02-23 MED ORDER — SCOPOLAMINE 1 MG/3DAYS TD PT72
MEDICATED_PATCH | TRANSDERMAL | Status: DC | PRN
  Administered 2020-02-23: 1 via TRANSDERMAL

## 2020-02-23 MED ORDER — ONDANSETRON HCL 4 MG PO TABS: 4.0000 mg | ORAL_TABLET | Freq: Three times a day (TID) | ORAL | 0 refills | Status: DC | PRN

## 2020-02-23 MED ORDER — TRANEXAMIC ACID-NACL 1000-0.7 MG/100ML-% IV SOLN
1000.0000 mg | INTRAVENOUS | Status: AC
  Administered 2020-02-23: 1000 mg via INTRAVENOUS
  Filled 2020-02-23: qty 100

## 2020-02-23 MED ORDER — OXYCODONE-ACETAMINOPHEN 5-325 MG PO TABS: 1.0000 | ORAL_TABLET | ORAL | 0 refills | Status: DC | PRN

## 2020-02-23 MED ORDER — PROMETHAZINE HCL 25 MG PO TABS: 25.0000 mg | ORAL_TABLET | Freq: Four times a day (QID) | ORAL | 0 refills | Status: DC | PRN

## 2020-02-23 MED ORDER — EPHEDRINE 5 MG/ML INJ
INTRAVENOUS | Status: AC
  Filled 2020-02-23: qty 10

## 2020-02-23 MED ORDER — PROPOFOL 500 MG/50ML IV EMUL
INTRAVENOUS | Status: DC | PRN
  Administered 2020-02-23: 25 ug/kg/min via INTRAVENOUS

## 2020-02-23 MED ORDER — FENTANYL CITRATE (PF) 100 MCG/2ML IJ SOLN
50.0000 ug | Freq: Once | INTRAMUSCULAR | Status: AC
  Administered 2020-02-23: 50 ug via INTRAVENOUS
  Filled 2020-02-23: qty 2

## 2020-02-23 MED ORDER — PROPOFOL 10 MG/ML IV BOLUS
INTRAVENOUS | Status: DC | PRN
  Administered 2020-02-23: 20 mg via INTRAVENOUS
  Administered 2020-02-23: 140 mg via INTRAVENOUS

## 2020-02-23 MED ORDER — OXYCODONE HCL 5 MG/5ML PO SOLN: 5.0000 mg | Freq: Once | ORAL | Status: DC | PRN

## 2020-02-23 MED ORDER — SUGAMMADEX SODIUM 200 MG/2ML IV SOLN
INTRAVENOUS | Status: DC | PRN
  Administered 2020-02-23: 160 mg via INTRAVENOUS

## 2020-02-23 MED ORDER — ONDANSETRON HCL 4 MG/2ML IJ SOLN
INTRAMUSCULAR | Status: AC
  Filled 2020-02-23: qty 2

## 2020-02-23 MED ORDER — 0.9 % SODIUM CHLORIDE (POUR BTL) OPTIME
TOPICAL | Status: DC | PRN
  Administered 2020-02-23: 1000 mL

## 2020-02-23 MED ORDER — DEXAMETHASONE SODIUM PHOSPHATE 10 MG/ML IJ SOLN
INTRAMUSCULAR | Status: AC
  Filled 2020-02-23: qty 1

## 2020-02-23 MED ORDER — LIDOCAINE HCL (CARDIAC) PF 100 MG/5ML IV SOSY
PREFILLED_SYRINGE | INTRAVENOUS | Status: DC | PRN
  Administered 2020-02-23: 40 mg via INTRAVENOUS

## 2020-02-23 MED ORDER — ORAL CARE MOUTH RINSE: 15.0000 mL | Freq: Once | OROMUCOSAL | Status: AC

## 2020-02-23 MED ORDER — LACTATED RINGERS IV BOLUS
500.0000 mL | Freq: Once | INTRAVENOUS | Status: AC
  Administered 2020-02-23: 500 mL via INTRAVENOUS

## 2020-02-23 MED ORDER — CHLORHEXIDINE GLUCONATE 0.12 % MT SOLN
15.0000 mL | Freq: Once | OROMUCOSAL | Status: AC
  Administered 2020-02-23: 15 mL via OROMUCOSAL

## 2020-02-23 MED ORDER — PHENYLEPHRINE 40 MCG/ML (10ML) SYRINGE FOR IV PUSH (FOR BLOOD PRESSURE SUPPORT)
PREFILLED_SYRINGE | INTRAVENOUS | Status: DC | PRN
  Administered 2020-02-23: 40 ug via INTRAVENOUS
  Administered 2020-02-23: 120 ug via INTRAVENOUS

## 2020-02-23 MED ORDER — PHENYLEPHRINE HCL-NACL 10-0.9 MG/250ML-% IV SOLN
INTRAVENOUS | Status: AC
  Filled 2020-02-23: qty 250

## 2020-02-23 MED ORDER — ONDANSETRON HCL 4 MG/2ML IJ SOLN: 4.0000 mg | Freq: Once | INTRAMUSCULAR | Status: DC | PRN

## 2020-02-23 MED ORDER — ONDANSETRON HCL 4 MG/2ML IJ SOLN
INTRAMUSCULAR | Status: DC | PRN
  Administered 2020-02-23: 4 mg via INTRAVENOUS

## 2020-02-23 MED ORDER — PHENYLEPHRINE 40 MCG/ML (10ML) SYRINGE FOR IV PUSH (FOR BLOOD PRESSURE SUPPORT)
PREFILLED_SYRINGE | INTRAVENOUS | Status: AC
  Filled 2020-02-23: qty 10

## 2020-02-23 MED ORDER — ONDANSETRON HCL 4 MG/2ML IJ SOLN
4.0000 mg | Freq: Once | INTRAMUSCULAR | Status: AC
  Administered 2020-02-23: 4 mg via INTRAVENOUS

## 2020-02-23 MED ORDER — EPHEDRINE SULFATE-NACL 50-0.9 MG/10ML-% IV SOSY
PREFILLED_SYRINGE | INTRAVENOUS | Status: DC | PRN
  Administered 2020-02-23: 5 mg via INTRAVENOUS

## 2020-02-23 MED ORDER — STERILE WATER FOR IRRIGATION IR SOLN
Status: DC | PRN
  Administered 2020-02-23: 2000 mL

## 2020-02-23 MED ORDER — SUCCINYLCHOLINE CHLORIDE 200 MG/10ML IV SOSY
PREFILLED_SYRINGE | INTRAVENOUS | Status: DC | PRN
  Administered 2020-02-23: 150 mg via INTRAVENOUS

## 2020-02-23 MED ORDER — SUCCINYLCHOLINE CHLORIDE 200 MG/10ML IV SOSY
PREFILLED_SYRINGE | INTRAVENOUS | Status: AC
  Filled 2020-02-23: qty 10

## 2020-02-23 MED ORDER — MIDAZOLAM HCL 2 MG/2ML IJ SOLN
1.0000 mg | INTRAMUSCULAR | Status: DC
  Administered 2020-02-23: 1 mg via INTRAVENOUS
  Filled 2020-02-23: qty 2

## 2020-02-23 MED ORDER — PHENYLEPHRINE HCL-NACL 10-0.9 MG/250ML-% IV SOLN
INTRAVENOUS | Status: DC | PRN
  Administered 2020-02-23: 40 ug/min via INTRAVENOUS

## 2020-02-23 MED ORDER — LACTATED RINGERS IV SOLN: INTRAVENOUS | Status: DC

## 2020-02-23 MED ORDER — SCOPOLAMINE 1 MG/3DAYS TD PT72
MEDICATED_PATCH | TRANSDERMAL | Status: AC
  Filled 2020-02-23: qty 1

## 2020-02-23 MED ORDER — FENTANYL CITRATE (PF) 100 MCG/2ML IJ SOLN: 25.0000 ug | INTRAMUSCULAR | Status: DC | PRN

## 2020-02-23 MED ORDER — DEXAMETHASONE SODIUM PHOSPHATE 10 MG/ML IJ SOLN
INTRAMUSCULAR | Status: DC | PRN
  Administered 2020-02-23: 8 mg via INTRAVENOUS

## 2020-02-23 MED ORDER — ACETAMINOPHEN 10 MG/ML IV SOLN
INTRAVENOUS | Status: AC
  Filled 2020-02-23: qty 100

## 2020-02-23 SURGICAL SUPPLY — 74 items
ADH SKN CLS APL DERMABOND .7 (GAUZE/BANDAGES/DRESSINGS) ×1
AID PSTN UNV HD RSTRNT DISP (MISCELLANEOUS) ×1
BAG SPEC THK2 15X12 ZIP CLS (MISCELLANEOUS) ×1
BAG ZIPLOCK 12X15 (MISCELLANEOUS) ×3 IMPLANT
BLADE SAW SGTL 83.5X18.5 (BLADE) ×3 IMPLANT
BSPLAT GLND +2X24 MDLR (Joint) ×1 IMPLANT
COOLER ICEMAN CLASSIC (MISCELLANEOUS) ×3 IMPLANT
COVER BACK TABLE 60X90IN (DRAPES) ×3 IMPLANT
COVER SURGICAL LIGHT HANDLE (MISCELLANEOUS) ×3 IMPLANT
COVER WAND RF STERILE (DRAPES) ×3 IMPLANT
CUP SUT UNIV REVERS 36 NEUTRAL (Cup) ×3 IMPLANT
DERMABOND ADVANCED (GAUZE/BANDAGES/DRESSINGS) ×2
DERMABOND ADVANCED .7 DNX12 (GAUZE/BANDAGES/DRESSINGS) ×1 IMPLANT
DRAPE INCISE IOBAN 66X45 STRL (DRAPES) IMPLANT
DRAPE ORTHO SPLIT 77X108 STRL (DRAPES) ×6
DRAPE SHEET LG 3/4 BI-LAMINATE (DRAPES) ×3 IMPLANT
DRAPE SURG 17X11 SM STRL (DRAPES) ×3 IMPLANT
DRAPE SURG ORHT 6 SPLT 77X108 (DRAPES) ×2 IMPLANT
DRAPE U-SHAPE 47X51 STRL (DRAPES) ×3 IMPLANT
DRESSING AQUACEL AG SP 3.5X10 (GAUZE/BANDAGES/DRESSINGS) IMPLANT
DRESSING AQUACEL AG SP 3.5X6 (GAUZE/BANDAGES/DRESSINGS) ×1 IMPLANT
DRSG AQUACEL AG ADV 3.5X10 (GAUZE/BANDAGES/DRESSINGS) ×3 IMPLANT
DRSG AQUACEL AG SP 3.5X10 (GAUZE/BANDAGES/DRESSINGS)
DRSG AQUACEL AG SP 3.5X6 (GAUZE/BANDAGES/DRESSINGS) ×3
DURAPREP 26ML APPLICATOR (WOUND CARE) ×3 IMPLANT
ELECT BLADE TIP CTD 4 INCH (ELECTRODE) ×3 IMPLANT
ELECT REM PT RETURN 15FT ADLT (MISCELLANEOUS) ×3 IMPLANT
FACESHIELD WRAPAROUND (MASK) ×15 IMPLANT
GLENOID UNI REV MOD 24 +2 LAT (Joint) ×3 IMPLANT
GLENOSPHERE 36 +4 LAT/24 (Joint) ×3 IMPLANT
GLOVE BIO SURGEON STRL SZ7.5 (GLOVE) ×3 IMPLANT
GLOVE BIO SURGEON STRL SZ8 (GLOVE) ×3 IMPLANT
GLOVE SS BIOGEL STRL SZ 7 (GLOVE) ×1 IMPLANT
GLOVE SS BIOGEL STRL SZ 7.5 (GLOVE) ×1 IMPLANT
GLOVE SUPERSENSE BIOGEL SZ 7 (GLOVE) ×2
GLOVE SUPERSENSE BIOGEL SZ 7.5 (GLOVE) ×2
GLOVE SURG SYN 7.0 (GLOVE) IMPLANT
GLOVE SURG SYN 7.5  E (GLOVE)
GLOVE SURG SYN 7.5 E (GLOVE) IMPLANT
GLOVE SURG SYN 8.0 (GLOVE) IMPLANT
GOWN STRL REUS W/TWL LRG LVL3 (GOWN DISPOSABLE) ×6 IMPLANT
INSERT HUMERAL 36 +6 (Shoulder) ×3 IMPLANT
KIT BASIN OR (CUSTOM PROCEDURE TRAY) ×3 IMPLANT
KIT TURNOVER KIT A (KITS) IMPLANT
MANIFOLD NEPTUNE II (INSTRUMENTS) ×3 IMPLANT
NEEDLE TAPERED W/ NITINOL LOOP (MISCELLANEOUS) ×3 IMPLANT
NS IRRIG 1000ML POUR BTL (IV SOLUTION) ×3 IMPLANT
PACK SHOULDER (CUSTOM PROCEDURE TRAY) ×3 IMPLANT
PAD ARMBOARD 7.5X6 YLW CONV (MISCELLANEOUS) ×3 IMPLANT
PAD COLD SHLDR WRAP-ON (PAD) ×3 IMPLANT
PIN NITINOL TARGETER 2.8 (PIN) IMPLANT
PIN SET MODULAR GLENOID SYSTEM (PIN) ×3 IMPLANT
RESTRAINT HEAD UNIVERSAL NS (MISCELLANEOUS) ×3 IMPLANT
SCREW CENTRAL MOD 30MM (Screw) ×3 IMPLANT
SCREW PERI LOCK 5.5X16 (Screw) ×3 IMPLANT
SCREW PERI LOCK 5.5X36 (Screw) ×3 IMPLANT
SCREW PERIPHERAL 5.5X20 LOCK (Screw) ×3 IMPLANT
SCREW PERIPHERAL 5.5X28 LOCK (Screw) ×3 IMPLANT
SLING ARM FOAM STRAP LRG (SOFTGOODS) IMPLANT
SLING ARM FOAM STRAP MED (SOFTGOODS) ×3 IMPLANT
SPONGE LAP 18X18 RF (DISPOSABLE) IMPLANT
STEM HUMERAL UNI REVERS SZ6 (Stem) ×3 IMPLANT
SUCTION FRAZIER HANDLE 12FR (TUBING) ×3
SUCTION TUBE FRAZIER 12FR DISP (TUBING) ×1 IMPLANT
SUT FIBERWIRE #2 38 T-5 BLUE (SUTURE)
SUT MNCRL AB 3-0 PS2 18 (SUTURE) ×3 IMPLANT
SUT MON AB 2-0 CT1 36 (SUTURE) ×3 IMPLANT
SUT VIC AB 1 CT1 36 (SUTURE) ×3 IMPLANT
SUTURE FIBERWR #2 38 T-5 BLUE (SUTURE) IMPLANT
SUTURE TAPE 1.3 40 TPR END (SUTURE) ×2 IMPLANT
SUTURETAPE 1.3 40 TPR END (SUTURE) ×6
TOWEL OR 17X26 10 PK STRL BLUE (TOWEL DISPOSABLE) ×3 IMPLANT
TOWEL OR NON WOVEN STRL DISP B (DISPOSABLE) ×3 IMPLANT
WATER STERILE IRR 1000ML POUR (IV SOLUTION) ×6 IMPLANT

## 2020-02-23 NOTE — Discharge Instructions (Signed)
 Kevin M. Supple, M.D., F.A.A.O.S. Orthopaedic Surgery Specializing in Arthroscopic and Reconstructive Surgery of the Shoulder [phone] [address] - , [place] [address] - Fax [phone]   POST-OP TOTAL SHOULDER REPLACEMENT INSTRUCTIONS  1. Follow up in the office for your first post-op appointment 10-14 days from the date of your surgery. If you do not already have a scheduled appointment, our office will contact you to schedule.  2. The bandage over your incision is waterproof. You may begin showering with this dressing on. You may leave this dressing on until first follow up appointment within 2 weeks. We prefer you leave this dressing in place until follow up however after 5-7 days if you are having itching or skin irritation and would like to remove it you may do so. Go slow and tug at the borders gently to break the bond the dressing has with the skin. At this point if there is no drainage it is okay to go without a bandage or you may cover it with a light guaze and tape. You can also expect significant bruising around your shoulder that will drift down your arm and into your chest wall. This is very normal and should resolve over several days.   3. Wear your sling/immobilizer at all times except to perform the exercises below or to occasionally let your arm dangle by your side to stretch your elbow. You also need to sleep in your sling immobilizer until instructed otherwise. It is ok to remove your sling if you are sitting in a controlled environment and allow your arm to rest in a position of comfort by your side or on your lap with pillows to give your neck and skin a break from the sling. You may remove it to allow arm to dangle by side to shower. If you are up walking around and when you go to sleep at night you need to wear it.  4. Range of motion to your elbow, wrist, and hand are encouraged 3-5 times daily. Exercise to your hand and fingers helps to reduce  swelling you may experience.   5. Prescriptions for a pain medication and a muscle relaxant are provided for you. It is recommended that if you are experiencing pain that you pain medication alone is not controlling, add the muscle relaxant along with the pain medication which can give additional pain relief. The first 1-2 days is generally the most severe of your pain and then should gradually decrease. As your pain lessens it is recommended that you decrease your use of the pain medications to an "as needed basis'" only and to always comply with the recommended dosages of the pain medications.  6. Pain medications can produce constipation along with their use. If you experience this, the use of an over the counter stool softener or laxative daily is recommended.   7. For additional questions or concerns, please do not hesitate to call the office. If after hours there is an answering service to forward your concerns to the physician on call.  8.Pain control following an exparel block  To help control your post-operative pain you received a nerve block  performed with Exparel which is a long acting anesthetic (numbing agent) which can provide pain relief and sensations of numbness (and relief of pain) in the operative shoulder and arm for up to 3 days. Sometimes it provides mixed relief, meaning you may still have numbness in certain areas of the arm but can still be able to   move  parts of that arm, hand, and fingers. We recommend that your prescribed pain medications  be used as needed. We do not feel it is necessary to "pre medicate" and "stay ahead" of pain.  Taking narcotic pain medications when you are not having any pain can lead to unnecessary and potentially dangerous side effects.    9. Use the ice machine as much as possible in the first 5-7 days from surgery, then you can wean its use to as needed. The ice typically needs to be replaced every 6 hours, instead of ice you can actually freeze  water bottles to put in the cooler and then fill water around them to avoid having to purchase ice. You can have spare water bottles freezing to allow you to rotate them once they have melted. Try to have a thin shirt or light cloth or towel under the ice wrap to protect your skin.   10.  We recommend that you avoid any dental work or cleaning in the first 3 months following your joint replacement. This is to help minimize the possibility of infection from the bacteria in your mouth that enters your bloodstream during dental work. We also recommend that you take an antibiotic prior to your dental work for the first year after your shoulder replacement to further help reduce that risk. Please simply contact our office for antibiotics to be sent to your pharmacy prior to dental work.  11. Dental Antibiotics:  In most cases prophylactic antibiotics for Dental procdeures after total joint surgery are not necessary.  Exceptions are as follows:  1. History of prior total joint infection  2. Severely immunocompromised (Organ Transplant, cancer chemotherapy, Rheumatoid biologic meds such as Humera)  3. Poorly controlled diabetes (A1C &gt; 8.0, blood glucose over 200)  If you have one of these conditions, contact your surgeon for an antibiotic prescription, prior to your dental procedure.   POST-OP EXERCISES  Pendulum Exercises  Perform pendulum exercises while standing and bending at the waist. Support your uninvolved arm on a table or chair and allow your operated arm to hang freely. Make sure to do these exercises passively - not using you shoulder muscles. These exercises can be performed once your nerve block effects have worn off.  Repeat 20 times. Do 3 sessions per day.     

## 2020-02-23 NOTE — Progress Notes (Signed)
Assisted Dr. Joslin with right, ultrasound guided, interscalene  block. Side rails up, monitors on throughout procedure. See vital signs in flow sheet. Tolerated Procedure well. 

## 2020-02-23 NOTE — Anesthesia Procedure Notes (Signed)
Anesthesia Regional Block: Interscalene brachial plexus block   Pre-Anesthetic Checklist: ,, timeout performed, Correct Patient, Correct Site, Correct Laterality, Correct Procedure, Correct Position, site marked, Risks and benefits discussed,  Surgical consent,  Pre-op evaluation,  At surgeon's request and post-op pain management  Laterality: Right  Prep: chloraprep       Needles:  Injection technique: Single-shot  Needle Type: Stimulator Needle - 40      Needle Gauge: 22     Additional Needles:   Procedures:, nerve stimulator,,,,,,,  Narrative:  Start time: 02/23/2020 8:50 AM End time: 02/23/2020 9:00 AM Injection made incrementally with aspirations every 5 mL.  Performed by: Personally   Additional Notes: 20 cc 0.5% Bupivacaine with 1:200 epi 10 cc 1.3% Exparel

## 2020-02-23 NOTE — H&P (Signed)
Jacqueline Stephenson    Chief Complaint: RIGHT shoulder advanced osteoarthritis HPI: The patient is a 79 y.o. female with chronic and progressive increasing right shoulder pain and associated functional imitations related to advanced osteoarthritis and associated rotator cuff dysfunction.  Due to her increasing difficulties and failure to respond to conservative management she is brought to the operating this time for planned right shoulder reverse arthroplasty  Past Medical History:  Diagnosis Date  . Arthritis   . Bladder spasms   . Breast cancer ([facility]) 2021   Left  . Cataract    Left  . Diabetes mellitus without complication ([place])    Diet controlled  . GERD (gastroesophageal reflux disease)   . Heart murmur    in childhood, no problems  . High cholesterol   . Hypertension   . Shingles   . Vertigo     Past Surgical History:  Procedure Laterality Date  . BREAST EXCISIONAL BIOPSY    . BREAST LUMPECTOMY WITH RADIOACTIVE SEED LOCALIZATION Left 09/08/2019   Procedure: LEFT BREAST LUMPECTOMY WITH RADIOACTIVE SEED LOCALIZATION;  Surgeon: Erroll Luna, MD;  Location: [facility];  Service: General;  Laterality: Left;  . CATARACT EXTRACTION W/ INTRAOCULAR LENS IMPLANT Right   . COLONOSCOPY    . TRIGGER FINGER RELEASE Left     Family History  Problem Relation Age of Onset  . Heart disease Mother   . Heart disease Father     Social History:  reports that she has never smoked. She has never used smokeless tobacco. She reports current alcohol use. She reports that she does not use drugs.   Medications Prior to Admission  Medication Sig Dispense Refill  . acetaminophen (TYLENOL) 500 MG tablet Take 500 mg by mouth at bedtime.    Marland Kitchen anastrozole (ARIMIDEX) 1 MG tablet Take 1 tablet (1 mg total) by mouth daily. 90 tablet 4  . aspirin EC 81 MG tablet Take 81 mg by mouth daily.    . calcium carbonate (TUMS - DOSED IN MG ELEMENTAL CALCIUM) 500 MG chewable tablet Chew 2-4  tablets by mouth as needed for indigestion or heartburn.    . Cholecalciferol (VITAMIN D) 50 MCG (2000 UT) tablet Take 2,000 Units by mouth daily.     . diclofenac (VOLTAREN) 50 MG EC tablet Take 1 tablet (50 mg total) by mouth 2 (two) times daily as needed. 60 tablet 1  . diclofenac Sodium (VOLTAREN) 1 % GEL Apply 2 g topically daily as needed (pain).     Marland Kitchen lisinopril (PRINIVIL,ZESTRIL) 20 MG tablet Take 20 mg by mouth daily.     . Multiple Vitamin (MULTIVITAMIN WITH MINERALS) TABS tablet Take 1 tablet by mouth daily.    . rosuvastatin (CRESTOR) 10 MG tablet Take 10 mg by mouth every evening.  0  . tolterodine (DETROL LA) 4 MG 24 hr capsule Take 4 mg by mouth daily.    . famotidine (PEPCID) 20 MG tablet Take 20 mg by mouth 2 (two) times daily. (Patient not taking: Reported on 02/12/2020)    . fluorouracil (EFUDEX) 5 % cream Apply 1 application topically 2 (two) times daily. (Patient not taking: Reported on 02/12/2020)    . vitamin E 100 UNIT capsule Take 100 Units by mouth daily. (Patient not taking: Reported on 02/12/2020)       Physical Exam: Right shoulder demonstrates severely painful and restricted mobility as noted at her recent office visits.  Plain radiographs confirm advanced osteoarthritis with complete loss of joint space, subchondral sclerosis,  and significant peripheral osteophyte formation.  Vitals  Temp:  [98.2 F (36.8 C)] 98.2 F (36.8 C) (08/24 0808) Pulse Rate:  [72] 72 (08/24 0808) Resp:  [16] 16 (08/24 0808) BP: (188)/(91) 188/91 (08/24 0808) SpO2:  [98 %] 98 % (08/24 0808) Weight:  [77.3 kg] 77.3 kg (08/24 0751)  Assessment/Plan  Impression: RIGHT shoulder advanced osteoarthritis  Plan of Action: Procedure(s): REVERSE SHOULDER ARTHROPLASTY  Jacqueline Stephenson Jacqueline Stephenson 02/23/2020, 8:55 AM Contact # [phone]

## 2020-02-23 NOTE — Transfer of Care (Signed)
Immediate Anesthesia Transfer of Care Note  Patient: Jacqueline Stephenson  Procedure(s) Performed: REVERSE SHOULDER ARTHROPLASTY (Right Shoulder)  Patient Location: PACU  Anesthesia Type:GA combined with regional for post-op pain  Level of Consciousness: drowsy  Airway & Oxygen Therapy: Patient Spontanous Breathing and Patient connected to face mask oxygen  Post-op Assessment: Report given to RN and Post -op Vital signs reviewed and stable  Post vital signs: Reviewed and stable  Last Vitals:  Vitals Value Taken Time  BP 133/57 02/23/20 1130  Temp    Pulse 65 02/23/20 1131  Resp 14 02/23/20 1131  SpO2 99 % 02/23/20 1131  Vitals shown include unvalidated device data.  Last Pain:  Vitals:   02/23/20 0808  TempSrc: Oral         Complications: No complications documented.

## 2020-02-23 NOTE — Anesthesia Preprocedure Evaluation (Signed)
Anesthesia Evaluation  Patient identified by MRN, date of birth, ID band Patient awake    Reviewed: Allergy & Precautions, NPO status , Patient's Chart, lab work & pertinent test results  Airway Mallampati: II  TM Distance: >3 FB Neck ROM: Full    Dental  (+) Teeth Intact, Dental Advisory Given   Pulmonary    breath sounds clear to auscultation       Cardiovascular hypertension,  Rhythm:Regular Rate:Normal     Neuro/Psych    GI/Hepatic   Endo/Other  diabetes  Renal/GU      Musculoskeletal   Abdominal   Peds  Hematology   Anesthesia Other Findings   Reproductive/Obstetrics                             Anesthesia Physical Anesthesia Plan  ASA: III  Anesthesia Plan: General   Post-op Pain Management:  Regional for Post-op pain   Induction:   PONV Risk Score and Plan: Ondansetron and Dexamethasone  Airway Management Planned: Oral ETT  Additional Equipment:   Intra-op Plan:   Post-operative Plan: Extubation in OR  Informed Consent: I have reviewed the patients History and Physical, chart, labs and discussed the procedure including the risks, benefits and alternatives for the proposed anesthesia with the patient or authorized representative who has indicated his/her understanding and acceptance.     Dental advisory given  Plan Discussed with: CRNA and Anesthesiologist  Anesthesia Plan Comments:         Anesthesia Quick Evaluation

## 2020-02-23 NOTE — Op Note (Signed)
02/23/2020  11:06 AM  PATIENT:   Jacqueline Stephenson  79 y.o. female  PRE-OPERATIVE DIAGNOSIS:  RIGHT shoulder advanced osteoarthritis  POST-OPERATIVE DIAGNOSIS: Same  PROCEDURE: Right shoulder reverse arthroplasty utilizing a press-fit size 6 Arthrex stem with a +6 polyethylene insert on a neutral metaphysis and a 36/+4 glenosphere on a small/+2 baseplate  SURGEON:  Victorya Hillman, Metta Clines M.D.  ASSISTANTS: Jenetta Loges, PA-C  ANESTHESIA:   General endotracheal and interscalene block with Exparel  EBL: Less than 100 cc  SPECIMEN: None  Drains: None   PATIENT DISPOSITION:  PACU - hemodynamically stable.    PLAN OF CARE: Discharge to home after PACU  Brief history:  Ms. Magaw is a 79 year old female who has had chronic and progressively increasing bilateral shoulder pain related to advanced osteoarthritis.  Due to her increasing pain and functional imitations and failure to respond to conservative management she is brought to the operating at this time for planned right shoulder reverse arthroplasty.  She has chosen to proceed with her right shoulder arthroplasty first.  Preoperatively, I counseled the patient regarding treatment options and risks versus benefits thereof.  Possible surgical complications were all reviewed including potential for bleeding, infection, neurovascular injury, persistent pain, loss of motion, anesthetic complication, failure of the implant, and possible need for additional surgery. They understand and accept and agrees with our planned procedure.   Procedure in detail:  After undergoing routine preop evaluation patient received prophylactic antibiotics and interscalene block with Exparel was established in the holding area by the anesthesia department.  Patient subsequently placed supine on the operating table and underwent the smooth induction of a general endotracheal anesthesia.  Placed into the beachchair position and appropriately padded and  protected.  The right shoulder girdle region was then sterilely prepped and draped in standard fashion.  Timeout was called.  An anterior deltopectoral approach to the right shoulder is made through an approximate centimeter incision.  Skin flaps elevated dissection carried deeply electrocautery was used for hemostasis.  The deltopectoral interval was then developed from proximal to distal with the vein taken laterally in the upper centimeter of the pectoralis major was tenotomized for exposure and the conjoined tendon was then mobilized and retracted medially.  The long head biceps tendon was then tenodesed the at the upper border of the pectoralis major tendon the proximal segment was then excised.  Rotator cuff was split to the base of the coracoid along the rotator interval and then the subscapularis was elevated from the lesser tuberosity and the free margin was tagged with a pair of suture tape sutures.  Capsular attachments were then divided from the anterior and inferior margins of the humeral neck allowing deliver the humeral head through the wound.  Extra medullary guide was then utilized to outline our proposed humeral head resection which was then performed at approximate 25 degrees of retroversion matching the native retroversion and then the remaining osteophytes on the margin of the humeral neck were removed with rondure.  A metal cap was then placed over the cut proximal humeral surface.  We then exposed the glenoid with the appropriate retractors and gained complete visualization such that we could perform a circumferential labral resection.  Once we gained complete visualization of the margins of the glenoid a guidepin was then directed into the center of the glenoid with an approximate 10 degree inferior tilt and the glenoid was then reamed with the central followed by the peripheral reamers.  Central drill hole was then completed and tapped  and a 30 mm lag screw was selected.  Our baseplate was  then assembled and inserted with excellent fit and fixation.  The peripheral locking screws were all then placed with excellent fit and fixation.  A 36/+4 glenosphere was then impacted onto the baseplate and the central locking screw was placed with good fixation.  We then returned our attention back to the proximal humerus where the canal was opened with a hand reamer and then we broached to a size 6 which showed excellent fit and fixation.  A neutral metaphyseal reaming was then performed.  Trial reduction showed good soft tissue balance good motion and good stability.  A trial implant was then removed and the final implant was assembled on the back table.  The final implant was then impacted with excellent fit and fixation.  We then performed a series of trial reductions and ultimately felt that the +6 polyethylene insert gave [place] the best soft tissue balance good stability good motion.  The trial was then removed the final polywas then impacted final reduction was performed again very pleased with the overall motion stability and soft tissue balance.  The joint was copiously irrigated.  The subscapularis was confirmed to be mobilized and was then repaired back to the eyelets on the collar of the implant using our previously placed suture tape sutures.  Upon completion the arm easily achieved 30 degrees of external rotation.  The deltopectoral interval was then reapproximated with a series of figure-of-eight and 1 Vicryl sutures.  2-0 Vicryl used for subcu layer and intracuticular 3 Monocryl for the skin followed by Dermabond and Aquacel dressing.  Right arm was then placed in a sling the patient was awakened, extubated, and taken to the recovery room in stable condition.  Jenetta Loges, PA-C was utilized as an Environmental consultant throughout this case, essential for help with positioning the patient, positioning extremity, tissue manipulation, implantation of the prosthesis, suture management, wound closure, and  intraoperative decision-making.  Marin Shutter MD   Contact # [phone]

## 2020-02-23 NOTE — Progress Notes (Signed)
Pt called out she was very nauseous and had a severe headache. Pt was diaphoretic upon entering room. Dr. Linna Caprice made aware and at bedside. Orders for Zofran and IV Tylenol.

## 2020-02-23 NOTE — Progress Notes (Signed)
Pt stating she feels less nauseous at this time. States a decrease in head pain as well.

## 2020-02-23 NOTE — Anesthesia Procedure Notes (Signed)
Procedure Name: Intubation Date/Time: 02/23/2020 9:50 AM Performed by: Raenette Rover, CRNA Pre-anesthesia Checklist: Patient identified, Emergency Drugs available, Suction available and Patient being monitored Patient Re-evaluated:Patient Re-evaluated prior to induction Oxygen Delivery Method: Circle system utilized Preoxygenation: Pre-oxygenation with 100% oxygen Induction Type: IV induction, Rapid sequence and Cricoid Pressure applied Laryngoscope Size: Mac and 3 Grade View: Grade I Tube type: Oral Tube size: 7.0 mm Number of attempts: 1 Airway Equipment and Method: Stylet Placement Confirmation: ETT inserted through vocal cords under direct vision,  positive ETCO2 and breath sounds checked- equal and bilateral Secured at: 21 cm Tube secured with: Tape Dental Injury: Teeth and Oropharynx as per pre-operative assessment

## 2020-02-23 NOTE — Evaluation (Signed)
Occupational Therapy Evaluation Patient Details Name: Jacqueline Stephenson MRN: [id number] DOB: Sep 29, 1940 Today's Date: 02/23/2020    History of Present Illness    Right shoulder reverse arthroplasty    Clinical Impression   OT eval and education complete    Follow Up Recommendations  Follow surgeon's recommendation for DC plan and follow-up therapies    Equipment Recommendations  None recommended by OT    Recommendations for Other Services       Precautions / Restrictions Precautions Precautions: Shoulder Type of Shoulder Precautions: OK to use operative arm for feeding, hygiene and ADLs. Ok to instruct Pendulums and lap slides as exercises. Ok to use operative arm within the following parameters for ADL purposes New ROM (8/18) Ok for PROM, AAROM, AROM within pain tolerance and within the following ROM ER 20 ABD 45 FE 60 Shoulder Interventions: Shoulder sling/immobilizer Precaution Comments: OK to use operative arm for feeding, hygiene and ADLs. Ok to instruct Pendulums and lap slides as exercises. Ok to use operative arm within the following parameters for ADL purposes New ROM (8/18) Ok for PROM, AAROM, AROM within pain tolerance and within the following ROM ER 20 ABD 45 FE 60      Mobility Bed Mobility        NT          Transfers          overall min A (hand held)                ADL either performed or assessed with clinical judgement                      Pertinent Vitals/Pain Pain Assessment: No/denies pain        Extremity/Trunk Assessment Upper Extremity Assessment Upper Extremity Assessment: RUE deficits/detail RUE Deficits / Details: s/p shoulder sx           Communication Communication Communication: No difficulties   Cognition Arousal/Alertness: Awake/alert Behavior During Therapy: WFL for tasks assessed/performed Overall Cognitive Status: Within Functional Limits for tasks assessed                                            Shoulder Instructions Shoulder Instructions Donning/doffing shirt without moving shoulder: Minimal assistance;Caregiver independent with task;Patient able to independently direct caregiver Method for sponge bathing under operated UE: Minimal assistance;Patient able to independently direct caregiver;Caregiver independent with task Donning/doffing sling/immobilizer: Minimal assistance;Caregiver independent with task;Patient able to independently direct caregiver Correct positioning of sling/immobilizer: Minimal assistance;Patient able to independently direct caregiver Pendulum exercises (written home exercise program): Minimal assistance;Caregiver independent with task;Patient able to independently direct caregiver ROM for elbow, wrist and digits of operated UE: Minimal assistance;Caregiver independent with task;Patient able to independently direct caregiver Sling wearing schedule (on at all times/off for ADL's): Minimal assistance;Caregiver independent with task;Patient able to independently direct caregiver Proper positioning of operated UE when showering: Minimal assistance;Caregiver independent with task;Patient able to independently direct caregiver Positioning of UE while sleeping: Minimal assistance;Patient able to independently direct caregiver    Home Living Family/patient expects to be discharged to:: Private residence Living Arrangements: Spouse/significant other Available Help at Discharge: Family Type of Home: [place]: None          Prior Functioning/Environment  Level of Independence: Independent                 OT Problem List: Decreased strength;Impaired balance (sitting and/or standing);Decreased range of motion;Impaired UE functional use      OT Treatment/Interventions:      OT Goals(Current goals can be found in the care plan section) Acute Rehab OT Goals Patient Stated Goal: home today and then  get other shoulder done OT Goal Formulation: With patient Time For Goal Achievement: 03/08/20  OT Frequency:     Barriers to D/C:               AM-PAC OT "6 Clicks" Daily Activity     Outcome Measure Help from another person eating meals?: A Little Help from another person taking care of personal grooming?: A Little Help from another person toileting, which includes using toliet, bedpan, or urinal?: A Lot Help from another person bathing (including washing, rinsing, drying)?: A Lot Help from another person to put on and taking off regular upper body clothing?: A Little Help from another person to put on and taking off regular lower body clothing?: A Lot 6 Click Score: 15   End of Session Nurse Communication: Mobility status  Activity Tolerance: Patient tolerated treatment well Patient left: in chair;with call bell/phone within reach;with family/visitor present  OT Visit Diagnosis: Unsteadiness on feet (R26.81);Muscle weakness (generalized) (M62.81)                Time: 6244-6950 OT Time Calculation (min): 30 min Charges:  OT General Charges $OT Visit: 1 Visit OT Evaluation $OT Eval Moderate Complexity: 1 Mod OT Treatments $Self Care/Home Management : 8-22 mins  Kari Baars, OT Acute Rehabilitation Services [phone] Office- [phone], Edwena Felty D 02/23/2020, 3:05 PM

## 2020-02-24 ENCOUNTER — Encounter (HOSPITAL_COMMUNITY): Payer: Self-pay | Admitting: Orthopedic Surgery

## 2020-02-24 ENCOUNTER — Encounter (HOSPITAL_COMMUNITY): Payer: Medicare Other

## 2020-02-24 NOTE — Anesthesia Postprocedure Evaluation (Signed)
Anesthesia Post Note  Patient: Jacqueline Stephenson  Procedure(s) Performed: REVERSE SHOULDER ARTHROPLASTY (Right Shoulder)     Patient location during evaluation: PACU Anesthesia Type: General and Regional Level of consciousness: awake and alert Pain management: pain level controlled Vital Signs Assessment: post-procedure vital signs reviewed and stable Respiratory status: spontaneous breathing, nonlabored ventilation, respiratory function stable and patient connected to nasal cannula oxygen Cardiovascular status: blood pressure returned to baseline and stable Postop Assessment: no apparent nausea or vomiting Anesthetic complications: no   No complications documented.  Last Vitals:  Vitals:   02/23/20 1445 02/23/20 1446  BP: (!) 145/77 (!) 145/77  Pulse:  75  Resp:  16  Temp:  36.4 C  SpO2:  93%    Last Pain:  Vitals:   02/23/20 1446  TempSrc:   PainSc: 0-No pain                 Sabin Gibeault COKER

## 2020-02-25 ENCOUNTER — Other Ambulatory Visit: Payer: Self-pay | Admitting: Oncology

## 2020-02-25 DIAGNOSIS — C50312 Malignant neoplasm of lower-inner quadrant of left female breast: Secondary | ICD-10-CM

## 2020-02-25 DIAGNOSIS — E2839 Other primary ovarian failure: Secondary | ICD-10-CM

## 2020-02-27 ENCOUNTER — Other Ambulatory Visit: Payer: Self-pay | Admitting: Oncology

## 2020-03-04 DIAGNOSIS — Z471 Aftercare following joint replacement surgery: Secondary | ICD-10-CM | POA: Diagnosis not present

## 2020-03-04 DIAGNOSIS — Z96612 Presence of left artificial shoulder joint: Secondary | ICD-10-CM | POA: Diagnosis not present

## 2020-03-16 DIAGNOSIS — M25511 Pain in right shoulder: Secondary | ICD-10-CM | POA: Diagnosis not present

## 2020-03-22 DIAGNOSIS — M25511 Pain in right shoulder: Secondary | ICD-10-CM | POA: Diagnosis not present

## 2020-03-24 DIAGNOSIS — M25511 Pain in right shoulder: Secondary | ICD-10-CM | POA: Diagnosis not present

## 2020-03-29 DIAGNOSIS — M25511 Pain in right shoulder: Secondary | ICD-10-CM | POA: Diagnosis not present

## 2020-03-31 DIAGNOSIS — M25511 Pain in right shoulder: Secondary | ICD-10-CM | POA: Diagnosis not present

## 2020-04-04 DIAGNOSIS — M25511 Pain in right shoulder: Secondary | ICD-10-CM | POA: Diagnosis not present

## 2020-04-07 DIAGNOSIS — M25511 Pain in right shoulder: Secondary | ICD-10-CM | POA: Diagnosis not present

## 2020-04-11 DIAGNOSIS — M25511 Pain in right shoulder: Secondary | ICD-10-CM | POA: Diagnosis not present

## 2020-04-14 DIAGNOSIS — M25511 Pain in right shoulder: Secondary | ICD-10-CM | POA: Diagnosis not present

## 2020-04-18 DIAGNOSIS — M25511 Pain in right shoulder: Secondary | ICD-10-CM | POA: Diagnosis not present

## 2020-04-20 ENCOUNTER — Encounter (HOSPITAL_COMMUNITY): Payer: Self-pay

## 2020-04-20 DIAGNOSIS — Z23 Encounter for immunization: Secondary | ICD-10-CM | POA: Diagnosis not present

## 2020-04-21 DIAGNOSIS — M25511 Pain in right shoulder: Secondary | ICD-10-CM | POA: Diagnosis not present

## 2020-04-25 DIAGNOSIS — M25511 Pain in right shoulder: Secondary | ICD-10-CM | POA: Diagnosis not present

## 2020-04-25 NOTE — Progress Notes (Signed)
Jacqueline Stephenson  [phone] [phone]) [phone]     ID: Jacqueline Stephenson DOB: [birth year]  MR#: [id number]  JYN#:[id number]  Patient Care Team: Gaynelle Arabian, MD as PCP - General (Family Medicine) Erroll Luna, MD as Consulting Physician (General Surgery) Yolande Skoda, Virgie Dad, MD as Consulting Physician (Oncology) Marylynn Pearson, MD as Consulting Physician (Obstetrics and Gynecology) Gery Pray, MD as Consulting Physician (Radiation Oncology) Mauro Kaufmann, RN as Oncology Nurse Navigator Rockwell Germany, RN as Oncology Nurse Navigator Justice Britain, MD as Consulting Physician (Orthopedic Surgery) Chauncey Cruel, MD OTHER MD:   CHIEF COMPLAINT: Estrogen receptor positive lobular breast cancer  CURRENT TREATMENT: Anastrozole   INTERVAL HISTORY: Jacqueline Stephenson returns today for follow up of her estrogen receptor positive lobular breast cancer accompanied by her husband  She continues on anastrozole for her lobular breast cancer, with good tolerance.  Hot flashes and vaginal dryness are not issues for her.  She obtains the drug at a very good price.  She is scheduled for bone density screening on 06/10/2020 and for annual mammography on 08/01/2020.   REVIEW OF SYSTEMS: Jacqueline Stephenson continues to rehab her right shoulder.  This is very painful.  She just got some extra exercises and they.  He wants not to.  She needed to use a cream that she gets from [place] and some Tylenol just so she could sleep.  In addition she has been minimally constipated.  She is using prune juice and some stool softeners for that.  Aside from those issues a detailed review of systems today was stable   HISTORY OF CURRENT ILLNESS: From the original intake note:  Jacqueline Stephenson presented for her annual mammogram at [facility] on 07/30/2019 with left nipple inversion. She denied palpating any masses. She underwent bilateral diagnostic mammography with tomography and left  breast ultrasonography showing: breast density category B; suspicious 0.9 cm mass at 6-7 o'clock in the left breast; 3 mm benign cyst in the outer retroareolar left breast at 3 o'clock; left axilla negative for lymphadenopathy.  Accordingly on 08/06/2019 she proceeded to biopsy of the left breast area in question. The pathology from this procedure ([id number]) showed: invasive mammary carcinoma, grade 2, e-cadherin negative. Prognostic indicators significant for: estrogen receptor, 95% positive and progesterone receptor, 30% positive, both with strong staining intensity. Proliferation marker Ki67 at 5%. HER2 equivocal by immunohistochemistry (2+), but negative by fluorescent in situ hybridization with a signals ratio 1.4 and number per cell 1.75.  She underwent breast MRI yesterday, 08/24/2019, which showed: No suspicious masses in the right breast no abnormal appearing lymph nodes complex mass in the lower outer left breast which is postbiopsy change and hematoma primarily.  She has an appointment with Dr. Sondra Come 08/26/2019.  She is scheduled for left lumpectomy on 09/08/2019 under Dr. Brantley Stage.  The patient's subsequent history is as detailed below.   PAST MEDICAL HISTORY: Past Medical History:  Diagnosis Date  . Arthritis   . Bladder spasms   . Breast cancer ([place]) 2021   Left  . Cataract    Left  . Diabetes mellitus without complication ([place])    Diet controlled  . GERD (gastroesophageal reflux disease)   . Heart murmur    in childhood, no problems  . High cholesterol   . Hypertension   . Shingles   . Vertigo     PAST SURGICAL HISTORY: Past Surgical History:  Procedure Laterality Date  . BREAST EXCISIONAL BIOPSY    . BREAST LUMPECTOMY WITH RADIOACTIVE  SEED LOCALIZATION Left 09/08/2019   Procedure: LEFT BREAST LUMPECTOMY WITH RADIOACTIVE SEED LOCALIZATION;  Surgeon: Erroll Luna, MD;  Location: [facility];  Service: General;  Laterality: Left;  . CATARACT EXTRACTION W/  INTRAOCULAR LENS IMPLANT Right   . COLONOSCOPY    . REVERSE SHOULDER ARTHROPLASTY Right 02/23/2020   Procedure: REVERSE SHOULDER ARTHROPLASTY;  Surgeon: Justice Britain, MD;  Location: [facility];  Service: Orthopedics;  Laterality: Right;  122mn  . TRIGGER FINGER RELEASE Left     FAMILY HISTORY: Family History  Problem Relation Age of Onset  . Heart disease Mother   . Heart disease Father    Jacqueline Stephenson's father died in his late 639sfrom heart disease.  He was a smoker.  The patient's mother died at age 3646also from heart disease.  She was not a smoker.  There is no breast ovarian pancreatic or prostate cancer in the family to the patient's knowledge.  The patient has no brothers or sisters.   GYNECOLOGIC HISTORY:  No LMP recorded. Patient is postmenopausal. Menarche: 79years old Age at first live birth: 79years old [place] LMP late 462sContraceptive: Oral contraceptives less than 1 year HRT no  Hysterectomy? no BSO?  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  They have been married more than 50 years.  Their child GMarya Stephenson 423years old, [place] [employer]  DMarguerite Stephenson a child from an earlier marriage.  The patient has 2 grandchildren.  She attends a local BL[place]DIRECTIVES: In the absence of any documentation to the contrary, the patient's spouse is their HCPOA.    HEALTH MAINTENANCE: Social History   Tobacco Use  . Smoking status: Never Smoker  . Smokeless tobacco: Never Used  Vaping Use  . Vaping Use: Never used  Substance Use Topics  . Alcohol use: Yes    Comment: Socially, rare  . Drug use: Never     Colonoscopy: [facility]  PAP: 2021  Bone density: "Normal" per patient   No Known Allergies  Current Outpatient Medications  Medication Sig Dispense Refill  . acetaminophen (TYLENOL) 500 MG tablet  Take 500 mg by mouth at bedtime.    .Marland Kitchenanastrozole (ARIMIDEX) 1 MG tablet Take 1 tablet (1 mg total) by mouth daily. 90 tablet 4  . aspirin EC 81 MG tablet Take 81 mg by mouth daily.    . calcium carbonate (TUMS - DOSED IN MG ELEMENTAL CALCIUM) 500 MG chewable tablet Chew 2-4 tablets by mouth as needed for indigestion or heartburn.    . Cholecalciferol (VITAMIN D) 50 MCG (2000 UT) tablet Take 2,000 Units by mouth daily.     . cyclobenzaprine (FLEXERIL) 10 MG tablet Take 1 tablet (10 mg total) by mouth 3 (three) times daily as needed for muscle spasms. 30 tablet 1  . diclofenac (VOLTAREN) 50 MG EC tablet TAKE 1 TABLET BY MOUTH 2 TIMES DAILY AS NEEDED. 60 tablet 1  . diclofenac Sodium (VOLTAREN) 1 % GEL Apply 2 g topically daily as needed (pain).     .Marland Kitchenlisinopril (PRINIVIL,ZESTRIL) 20 MG tablet Take 20 mg by mouth daily.     . Multiple Vitamin (MULTIVITAMIN WITH MINERALS) TABS tablet Take 1 tablet by mouth daily.    . ondansetron (ZOFRAN) 4 MG tablet Take 1  tablet (4 mg total) by mouth every 8 (eight) hours as needed for nausea or vomiting. 20 tablet 0  . oxyCODONE-acetaminophen (PERCOCET) 5-325 MG tablet Take 1 tablet by mouth every 4 (four) hours as needed (max 6 q). 20 tablet 0  . promethazine (PHENERGAN) 25 MG tablet Take 1 tablet (25 mg total) by mouth every 6 (six) hours as needed for nausea or vomiting (not controlled by zofran). 15 tablet 0  . rosuvastatin (CRESTOR) 10 MG tablet Take 10 mg by mouth every evening.  0  . tolterodine (DETROL LA) 4 MG 24 hr capsule Take 4 mg by mouth daily.     No current facility-administered medications for this visit.    OBJECTIVE:  white woman who appears stated age  10:   04/26/20 1210  BP: (!) 147/96  Pulse: 81  Resp: 18  Temp: 97.6 F (36.4 C)  SpO2: 100%     Body mass index is 25.15 kg/m.   Wt Readings from Last 3 Encounters:  04/26/20 165 lb 6.4 oz (75 kg)  02/23/20 170 lb 7 oz (77.3 kg)  02/17/20 170 lb 7 oz (77.3 kg)      ECOG  FS:1 - Symptomatic but completely ambulatory  Sclerae unicteric, EOMs intact Wearing a mask No cervical or supraclavicular adenopathy Lungs no rales or rhonchi Heart regular rate and rhythm Abd soft, nontender, positive bowel sounds MSK no focal spinal tenderness, no upper extremity lymphedema Neuro: nonfocal, well oriented, appropriate affect Breasts: The right breast is benign.  The left breast has undergone lumpectomy and radiation.  There is no evidence of disease recurrence.  Both axillae are benign.  LAB RESULTS:  CMP     Component Value Date/Time   NA 137 04/26/2020 1141   K 4.3 04/26/2020 1141   CL 105 04/26/2020 1141   CO2 28 04/26/2020 1141   GLUCOSE 132 (H) 04/26/2020 1141   BUN 12 04/26/2020 1141   CREATININE 0.93 04/26/2020 1141   CREATININE 1.08 (H) 08/25/2019 1524   CALCIUM 10.2 04/26/2020 1141   PROT 7.4 04/26/2020 1141   ALBUMIN 3.9 04/26/2020 1141   AST 15 04/26/2020 1141   AST 19 08/25/2019 1524   ALT 12 04/26/2020 1141   ALT 18 08/25/2019 1524   ALKPHOS 47 04/26/2020 1141   BILITOT 0.4 04/26/2020 1141   BILITOT 0.4 08/25/2019 1524   GFRNONAA >60 04/26/2020 1141   GFRNONAA 49 (L) 08/25/2019 1524   GFRAA >60 02/16/2020 1444   GFRAA 57 (L) 08/25/2019 1524    No results found for: TOTALPROTELP, ALBUMINELP, A1GS, A2GS, BETS, BETA2SER, GAMS, MSPIKE, SPEI  Lab Results  Component Value Date   WBC 7.7 04/26/2020   NEUTROABS 4.9 04/26/2020   HGB 13.3 04/26/2020   HCT 40.1 04/26/2020   MCV 90.1 04/26/2020   PLT 223 04/26/2020    No results found for: LABCA2  No components found for: [id number]  No results for input(s): INR in the last 168 hours.  No results found for: LABCA2  No results found for: XYD289  No results found for: TVN504  No results found for: HJS438  No results found for: CA2729  No components found for: HGQUANT  No results found for: CEA1 / No results found for: CEA1   No results found for: AFPTUMOR  No results found  for: CHROMOGRNA  No results found for: KPAFRELGTCHN, LAMBDASER, KAPLAMBRATIO (kappa/lambda light chains)  No results found for: HGBA, HGBA2QUANT, HGBFQUANT, HGBSQUAN (Hemoglobinopathy evaluation)   No results found for: LDH  No  results found for: IRON, TIBC, IRONPCTSAT (Iron and TIBC)  No results found for: FERRITIN  Urinalysis    Component Value Date/Time   COLORURINE YELLOW 12/02/2017 [place] 12/02/2017 1243   LABSPEC 1.014 12/02/2017 1243   PHURINE 6.0 12/02/2017 1243   GLUCOSEU NEGATIVE 12/02/2017 1243   HGBUR NEGATIVE 12/02/2017 1243   BILIRUBINUR NEGATIVE 12/02/2017 1243   KETONESUR NEGATIVE 12/02/2017 1243   PROTEINUR NEGATIVE 12/02/2017 1243   NITRITE NEGATIVE 12/02/2017 1243   LEUKOCYTESUR TRACE (A) 12/02/2017 1243    STUDIES: No results found.   ELIGIBLE FOR AVAILABLE RESEARCH PROTOCOL: AET  ASSESSMENT: 79 y.o. Jacqueline Stephenson woman status post left breast lower inner quadrant biopsy 08/06/2019 for a clinical T1b N0, stage IA invasive lobular carcinoma, E-cadherin negative, grade 2, estrogen and progesterone receptor positive, HER-2 not amplified, with an MIB-1 of 5%  (1) status post left lumpectomy with no sentinel lymph node sampling 09/08/2019 for a pT1c pNX, stage IA invasive lobular breast cancer, grade 1, with close but negative margins.  (2) adjuvant radiation 11/09/2019 through 12/08/2019 Site Technique Total Dose (Gy) Dose per Fx (Gy) Completed Fx Beam Energies  Breast, Left: Breast_Lt 3D 40.05/40.05 2.67 15/15 6X, 10X  Breast, Left: Breast_Lt_Bst 3D 12/12 2 6/6 6X, 10X    (3) started anastrozole 10/31/2019  (a) bone density January 2022   PLAN:  Jacqueline Stephenson continues to do well on anastrozole and the plan will be to continue that a total of 5 years.  She is scheduled for mammography in January.  She will have a bone density at the same time.  I will then see her in February.  Ideally she would see her surgeon Dr. Brantley Stage 6 months  after that, sometime in July or August.  She knows to call for any other issue that may develop before the next visit  Total encounter time 20 minutes.   Chauncey Cruel, MD   04/26/2020 12:40 PM Medical Oncology and Hematology [facility] [place], [place] [address] Tel. [phone]    Fax. [phone]   This document serves as a record of services personally performed by Lurline Del, MD. It was created on his behalf by Wilburn Mylar, a trained medical scribe. The creation of this record is based on the scribe's personal observations and the provider's statements to them.   I, Lurline Del MD, have reviewed the above documentation for accuracy and completeness, and I agree with the above.   *Total Encounter Time as defined by the Centers for Medicare and Medicaid Services includes, in addition to the face-to-face time of a patient visit (documented in the note above) non-face-to-face time: obtaining and reviewing outside history, ordering and reviewing medications, tests or procedures, care coordination (communications with other health care professionals or caregivers) and documentation in the medical record.

## 2020-04-26 ENCOUNTER — Other Ambulatory Visit: Payer: Self-pay

## 2020-04-26 ENCOUNTER — Inpatient Hospital Stay: Payer: Medicare Other

## 2020-04-26 ENCOUNTER — Inpatient Hospital Stay: Payer: Medicare Other | Attending: Oncology | Admitting: Oncology

## 2020-04-26 VITALS — BP 147/96 | HR 81 | Temp 97.6°F | Resp 18 | Ht 68.0 in | Wt 165.4 lb

## 2020-04-26 DIAGNOSIS — K219 Gastro-esophageal reflux disease without esophagitis: Secondary | ICD-10-CM | POA: Insufficient documentation

## 2020-04-26 DIAGNOSIS — C50312 Malignant neoplasm of lower-inner quadrant of left female breast: Secondary | ICD-10-CM | POA: Diagnosis not present

## 2020-04-26 DIAGNOSIS — I10 Essential (primary) hypertension: Secondary | ICD-10-CM | POA: Diagnosis not present

## 2020-04-26 DIAGNOSIS — E119 Type 2 diabetes mellitus without complications: Secondary | ICD-10-CM | POA: Diagnosis not present

## 2020-04-26 DIAGNOSIS — Z79899 Other long term (current) drug therapy: Secondary | ICD-10-CM | POA: Diagnosis not present

## 2020-04-26 DIAGNOSIS — M129 Arthropathy, unspecified: Secondary | ICD-10-CM | POA: Diagnosis not present

## 2020-04-26 DIAGNOSIS — Z7982 Long term (current) use of aspirin: Secondary | ICD-10-CM | POA: Insufficient documentation

## 2020-04-26 DIAGNOSIS — Z170 Estrogen receptor positive status [ER+]: Secondary | ICD-10-CM | POA: Diagnosis not present

## 2020-04-26 DIAGNOSIS — E7800 Pure hypercholesterolemia, unspecified: Secondary | ICD-10-CM | POA: Diagnosis not present

## 2020-04-26 DIAGNOSIS — Z79811 Long term (current) use of aromatase inhibitors: Secondary | ICD-10-CM | POA: Diagnosis not present

## 2020-04-26 LAB — COMPREHENSIVE METABOLIC PANEL
ALT: 12 U/L (ref 0–44)
AST: 15 U/L (ref 15–41)
Albumin: 3.9 g/dL (ref 3.5–5.0)
Alkaline Phosphatase: 47 U/L (ref 38–126)
Anion gap: 4 — ABNORMAL LOW (ref 5–15)
BUN: 12 mg/dL (ref 8–23)
CO2: 28 mmol/L (ref 22–32)
Calcium: 10.2 mg/dL (ref 8.9–10.3)
Chloride: 105 mmol/L (ref 98–111)
Creatinine, Ser: 0.93 mg/dL (ref 0.44–1.00)
GFR, Estimated: >60 mL/min (ref >60)
Glucose, Bld: 132 mg/dL — ABNORMAL HIGH (ref 70–99)
Potassium: 4.3 mmol/L (ref 3.5–5.1)
Sodium: 137 mmol/L (ref 135–145)
Total Bilirubin: 0.4 mg/dL (ref 0.3–1.2)
Total Protein: 7.4 g/dL (ref 6.5–8.1)

## 2020-04-26 LAB — CBC WITH DIFFERENTIAL/PLATELET
Abs Immature Granulocytes: 0.01 10*3/uL (ref 0.00–0.07)
Basophils Absolute: 0 10*3/uL (ref 0.0–0.1)
Basophils Relative: 1 %
Eosinophils Absolute: 0.3 10*3/uL (ref 0.0–0.5)
Eosinophils Relative: 4 %
HCT: 40.1 % (ref 36.0–46.0)
Hemoglobin: 13.3 g/dL (ref 12.0–15.0)
Immature Granulocytes: 0 %
Lymphocytes Relative: 26 %
Lymphs Abs: 2 10*3/uL (ref 0.7–4.0)
MCH: 29.9 pg (ref 26.0–34.0)
MCHC: 33.2 g/dL (ref 30.0–36.0)
MCV: 90.1 fL (ref 80.0–100.0)
Monocytes Absolute: 0.5 10*3/uL (ref 0.1–1.0)
Monocytes Relative: 7 %
Neutro Abs: 4.9 10*3/uL (ref 1.7–7.7)
Neutrophils Relative %: 62 %
Platelets: 223 10*3/uL (ref 150–400)
RBC: 4.45 MIL/uL (ref 3.87–5.11)
RDW: 12.9 % (ref 11.5–15.5)
WBC: 7.7 10*3/uL (ref 4.0–10.5)
nRBC: 0 % (ref 0.0–0.2)

## 2020-04-28 DIAGNOSIS — M899 Disorder of bone, unspecified: Secondary | ICD-10-CM | POA: Diagnosis not present

## 2020-04-28 DIAGNOSIS — Z0000 Encounter for general adult medical examination without abnormal findings: Secondary | ICD-10-CM | POA: Diagnosis not present

## 2020-04-28 DIAGNOSIS — M25511 Pain in right shoulder: Secondary | ICD-10-CM | POA: Diagnosis not present

## 2020-04-28 DIAGNOSIS — E1169 Type 2 diabetes mellitus with other specified complication: Secondary | ICD-10-CM | POA: Diagnosis not present

## 2020-04-28 DIAGNOSIS — I10 Essential (primary) hypertension: Secondary | ICD-10-CM | POA: Diagnosis not present

## 2020-04-28 DIAGNOSIS — C50919 Malignant neoplasm of unspecified site of unspecified female breast: Secondary | ICD-10-CM | POA: Diagnosis not present

## 2020-04-28 DIAGNOSIS — Z23 Encounter for immunization: Secondary | ICD-10-CM | POA: Diagnosis not present

## 2020-04-28 DIAGNOSIS — E7800 Pure hypercholesterolemia, unspecified: Secondary | ICD-10-CM | POA: Diagnosis not present

## 2020-04-28 DIAGNOSIS — E559 Vitamin D deficiency, unspecified: Secondary | ICD-10-CM | POA: Diagnosis not present

## 2020-04-28 DIAGNOSIS — Z1389 Encounter for screening for other disorder: Secondary | ICD-10-CM | POA: Diagnosis not present

## 2020-05-03 DIAGNOSIS — M25511 Pain in right shoulder: Secondary | ICD-10-CM | POA: Diagnosis not present

## 2020-05-05 DIAGNOSIS — M25511 Pain in right shoulder: Secondary | ICD-10-CM | POA: Diagnosis not present

## 2020-05-09 DIAGNOSIS — E119 Type 2 diabetes mellitus without complications: Secondary | ICD-10-CM | POA: Diagnosis not present

## 2020-05-09 DIAGNOSIS — H348312 Tributary (branch) retinal vein occlusion, right eye, stable: Secondary | ICD-10-CM | POA: Diagnosis not present

## 2020-05-09 DIAGNOSIS — H25812 Combined forms of age-related cataract, left eye: Secondary | ICD-10-CM | POA: Diagnosis not present

## 2020-05-09 DIAGNOSIS — M25511 Pain in right shoulder: Secondary | ICD-10-CM | POA: Diagnosis not present

## 2020-05-09 DIAGNOSIS — D3132 Benign neoplasm of left choroid: Secondary | ICD-10-CM | POA: Diagnosis not present

## 2020-05-09 DIAGNOSIS — Z961 Presence of intraocular lens: Secondary | ICD-10-CM | POA: Diagnosis not present

## 2020-05-12 DIAGNOSIS — M25511 Pain in right shoulder: Secondary | ICD-10-CM | POA: Diagnosis not present

## 2020-05-16 DIAGNOSIS — Z96611 Presence of right artificial shoulder joint: Secondary | ICD-10-CM | POA: Diagnosis not present

## 2020-05-16 DIAGNOSIS — M545 Low back pain, unspecified: Secondary | ICD-10-CM | POA: Diagnosis not present

## 2020-05-17 DIAGNOSIS — M25511 Pain in right shoulder: Secondary | ICD-10-CM | POA: Diagnosis not present

## 2020-05-19 DIAGNOSIS — M25511 Pain in right shoulder: Secondary | ICD-10-CM | POA: Diagnosis not present

## 2020-05-24 DIAGNOSIS — M25511 Pain in right shoulder: Secondary | ICD-10-CM | POA: Diagnosis not present

## 2020-05-30 DIAGNOSIS — M25511 Pain in right shoulder: Secondary | ICD-10-CM | POA: Diagnosis not present

## 2020-05-31 DIAGNOSIS — M5416 Radiculopathy, lumbar region: Secondary | ICD-10-CM | POA: Diagnosis not present

## 2020-05-31 DIAGNOSIS — I700 Atherosclerosis of aorta: Secondary | ICD-10-CM | POA: Diagnosis not present

## 2020-05-31 DIAGNOSIS — E7800 Pure hypercholesterolemia, unspecified: Secondary | ICD-10-CM | POA: Diagnosis not present

## 2020-06-02 DIAGNOSIS — M25511 Pain in right shoulder: Secondary | ICD-10-CM | POA: Diagnosis not present

## 2020-06-09 DIAGNOSIS — M25511 Pain in right shoulder: Secondary | ICD-10-CM | POA: Diagnosis not present

## 2020-06-10 ENCOUNTER — Ambulatory Visit
Admission: RE | Admit: 2020-06-10 | Discharge: 2020-06-10 | Disposition: A | Discharge status: Home / Self Care | Payer: Medicare Other | Source: Ambulatory Visit | Attending: Oncology | Admitting: Oncology

## 2020-06-10 ENCOUNTER — Other Ambulatory Visit: Payer: Self-pay

## 2020-06-10 DIAGNOSIS — E2839 Other primary ovarian failure: Secondary | ICD-10-CM

## 2020-06-10 DIAGNOSIS — Z780 Asymptomatic menopausal state: Secondary | ICD-10-CM | POA: Diagnosis not present

## 2020-06-10 DIAGNOSIS — M8500 Fibrous dysplasia (monostotic), unspecified site: Secondary | ICD-10-CM | POA: Diagnosis not present

## 2020-06-14 DIAGNOSIS — M25511 Pain in right shoulder: Secondary | ICD-10-CM | POA: Diagnosis not present

## 2020-06-16 DIAGNOSIS — M25511 Pain in right shoulder: Secondary | ICD-10-CM | POA: Diagnosis not present

## 2020-06-27 DIAGNOSIS — M5416 Radiculopathy, lumbar region: Secondary | ICD-10-CM | POA: Diagnosis not present

## 2020-07-07 DIAGNOSIS — M5416 Radiculopathy, lumbar region: Secondary | ICD-10-CM | POA: Diagnosis not present

## 2020-07-14 DIAGNOSIS — M5416 Radiculopathy, lumbar region: Secondary | ICD-10-CM | POA: Diagnosis not present

## 2020-07-21 DIAGNOSIS — M5416 Radiculopathy, lumbar region: Secondary | ICD-10-CM | POA: Diagnosis not present

## 2020-07-22 DIAGNOSIS — M5416 Radiculopathy, lumbar region: Secondary | ICD-10-CM | POA: Diagnosis not present

## 2020-08-13 DIAGNOSIS — M5459 Other low back pain: Secondary | ICD-10-CM | POA: Diagnosis not present

## 2020-08-15 NOTE — Progress Notes (Incomplete)
[place]  Telephone:([phone] Fax:(336) [phone]     ID: Jacqueline Stephenson DOB: June 04, 1941  MR#: [id number]  CNO#:[id number]  Patient Care Team: Gaynelle Arabian, MD as PCP - General (Family Medicine) Erroll Luna, MD as Consulting Physician (General Surgery) Magrinat, Virgie Dad, MD as Consulting Physician (Oncology) Marylynn Pearson, MD as Consulting Physician (Obstetrics and Gynecology) Gery Pray, MD as Consulting Physician (Radiation Oncology) Mauro Kaufmann, RN as Oncology Nurse Navigator Rockwell Germany, RN as Oncology Nurse Navigator Justice Britain, MD as Consulting Physician (Orthopedic Surgery) Aurea Graff OTHER MD:   CHIEF COMPLAINT: Estrogen receptor positive lobular breast cancer  CURRENT TREATMENT: Anastrozole   INTERVAL HISTORY: Jacqueline Stephenson returns today for follow up of her estrogen receptor positive lobular breast cancer accompanied by her husband  She continues on anastrozole for her lobular breast cancer, with good tolerance.  Hot flashes and vaginal dryness are not issues for her.  She obtains the drug at a very good price.  Since her last visit, she underwent bone density screening on 06/10/2020 showing a T-score of -1.3, which is considered osteopenic.  She is scheduled for annual mammography on 08/30/2020.   REVIEW OF SYSTEMS: Jacqueline Stephenson    COVID 19 VACCINATION STATUS:    HISTORY OF CURRENT ILLNESS: From the original intake note:  Jacqueline Stephenson presented for her annual mammogram at [facility] on 07/30/2019 with left nipple inversion. She denied palpating any masses. She underwent bilateral diagnostic mammography with tomography and left breast ultrasonography showing: breast density category B; suspicious 0.9 cm mass at 6-7 o'clock in the left breast; 3 mm benign cyst in the outer retroareolar left breast at 3 o'clock; left axilla negative for lymphadenopathy.  Accordingly on 08/06/2019 she proceeded to biopsy of  the left breast area in question. The pathology from this procedure ([id number]) showed: invasive mammary carcinoma, grade 2, e-cadherin negative. Prognostic indicators significant for: estrogen receptor, 95% positive and progesterone receptor, 30% positive, both with strong staining intensity. Proliferation marker Ki67 at 5%. HER2 equivocal by immunohistochemistry (2+), but negative by fluorescent in situ hybridization with a signals ratio 1.4 and number per cell 1.75.  She underwent breast MRI yesterday, 08/24/2019, which showed: No suspicious masses in the right breast no abnormal appearing lymph nodes complex mass in the lower outer left breast which is postbiopsy change and hematoma primarily.  She has an appointment with Dr. Sondra Come 08/26/2019.  She is scheduled for left lumpectomy on 09/08/2019 under Dr. Brantley Stage.  The patient's subsequent history is as detailed below.   PAST MEDICAL HISTORY: Past Medical History:  Diagnosis Date  . Arthritis   . Bladder spasms   . Breast cancer ([place]) 2021   Left  . Cataract    Left  . Diabetes mellitus without complication ([place])    Diet controlled  . GERD (gastroesophageal reflux disease)   . Heart murmur    in childhood, no problems  . High cholesterol   . Hypertension   . Shingles   . Vertigo     PAST SURGICAL HISTORY: Past Surgical History:  Procedure Laterality Date  . BREAST EXCISIONAL BIOPSY    . BREAST LUMPECTOMY WITH RADIOACTIVE SEED LOCALIZATION Left 09/08/2019   Procedure: LEFT BREAST LUMPECTOMY WITH RADIOACTIVE SEED LOCALIZATION;  Surgeon: Erroll Luna, MD;  Location: [facility];  Service: General;  Laterality: Left;  . CATARACT EXTRACTION W/ INTRAOCULAR LENS IMPLANT Right   . COLONOSCOPY    . REVERSE SHOULDER ARTHROPLASTY Right 02/23/2020   Procedure: REVERSE  SHOULDER ARTHROPLASTY;  Surgeon: Justice Britain, MD;  Location: [facility];  Service: Orthopedics;  Laterality: Right;  140mn  . TRIGGER FINGER RELEASE Left      FAMILY HISTORY: Family History  Problem Relation Age of Onset  . Heart disease Mother   . Heart disease Father   Jacqueline Stephenson's father died in his late 670sfrom heart disease.  He was a smoker.  The patient's mother died at age 3266also from heart disease.  She was not a smoker.  There is no breast ovarian pancreatic or prostate cancer in the family to the patient's knowledge.  The patient has no brothers or sisters.   GYNECOLOGIC HISTORY:  No LMP recorded. Patient is postmenopausal. Menarche: 80years old Age at first live birth: 80years old [place] Est[place] 1 LMP late 463sContraceptive: Oral contraceptives less than 1 year HRT no  Hysterectomy? no BSO?  No   SOCIAL HISTORY: (updated 08/2019)  SSheliseworked at [employer] light as did her husband DRisk manager  He is a former [employer] fast stream[employer]  They are both retired.  They have been married more than 50 years.  Their child GMarya Stephenson 432years old, [place] [employer]  DMarguerite Oleahas a child from an earlier marriage.  The patient has 2 grandchildren.  She attends a local [place]IVES: In the absence of any documentation to the contrary, the patient's spouse is their HCPOA.    HEALTH MAINTENANCE: Social History   Tobacco Use  . Smoking status: Never Smoker  . Smokeless tobacco: Never Used  Vaping Use  . Vaping Use: Never used  Substance Use Topics  . Alcohol use: Yes    Comment: Socially, rare  . Drug use: Never     Colonoscopy: [facility]  PAP: 2021  Bone density: "Normal" per patient   No Known Allergies  Current Outpatient Medications  Medication Sig Dispense Refill  . acetaminophen (TYLENOL) 500 MG tablet Take 500 mg by mouth at bedtime.    .Marland Kitchenanastrozole (ARIMIDEX) 1 MG tablet Take 1 tablet (1 mg total) by mouth daily. 90 tablet 4  . aspirin EC 81 MG tablet Take 81 mg by mouth daily.    . calcium carbonate (TUMS - DOSED IN MG ELEMENTAL CALCIUM) 500 MG chewable tablet Chew 2-4  tablets by mouth as needed for indigestion or heartburn.    . Cholecalciferol (VITAMIN D) 50 MCG (2000 UT) tablet Take 2,000 Units by mouth daily.     . cyclobenzaprine (FLEXERIL) 10 MG tablet Take 1 tablet (10 mg total) by mouth 3 (three) times daily as needed for muscle spasms. 30 tablet 1  . diclofenac (VOLTAREN) 50 MG EC tablet TAKE 1 TABLET BY MOUTH 2 TIMES DAILY AS NEEDED. 60 tablet 1  . diclofenac Sodium (VOLTAREN) 1 % GEL Apply 2 g topically daily as needed (pain).     .Marland Kitchenlisinopril (PRINIVIL,ZESTRIL) 20 MG tablet Take 20 mg by mouth daily.     . Multiple Vitamin (MULTIVITAMIN WITH MINERALS) TABS tablet Take 1 tablet by mouth daily.    . ondansetron (ZOFRAN) 4 MG tablet Take 1 tablet (4 mg total) by mouth every 8 (eight) hours as needed for nausea or vomiting. 20 tablet 0  . oxyCODONE-acetaminophen (PERCOCET) 5-325 MG tablet Take 1 tablet by mouth every 4 (four) hours as needed (max 6 q). 20 tablet 0  . promethazine (PHENERGAN) 25 MG tablet Take 1 tablet (25 mg total) by mouth  every 6 (six) hours as needed for nausea or vomiting (not controlled by zofran). 15 tablet 0  . rosuvastatin (CRESTOR) 10 MG tablet Take 10 mg by mouth every evening.  0  . tolterodine (DETROL LA) 4 MG 24 hr capsule Take 4 mg by mouth daily.     No current facility-administered medications for this visit.    OBJECTIVE:  white woman who appears stated age  There were no vitals filed for this visit.   There is no height or weight on file to calculate BMI.   Wt Readings from Last 3 Encounters:  04/26/20 165 lb 6.4 oz (75 kg)  02/23/20 170 lb 7 oz (77.3 kg)  02/17/20 170 lb 7 oz (77.3 kg)      ECOG FS:1 - Symptomatic but completely ambulatory  Sclerae unicteric, EOMs intact Wearing a mask No cervical or supraclavicular adenopathy Lungs no rales or rhonchi Heart regular rate and rhythm Abd soft, nontender, positive bowel sounds MSK no focal spinal tenderness, no upper extremity lymphedema Neuro: nonfocal,  well oriented, appropriate affect Breasts:    {Sclerae unicteric, EOMs intact Wearing a mask No cervical or supraclavicular adenopathy Lungs no rales or rhonchi Heart regular rate and rhythm Abd soft, nontender, positive bowel sounds MSK no focal spinal tenderness, no upper extremity lymphedema Neuro: nonfocal, well oriented, appropriate affect Breasts: The right breast is benign.  The left breast has undergone lumpectomy and radiation.  There is no evidence of disease recurrence.  Both axillae are benign.}  LAB RESULTS:  CMP     Component Value Date/Time   NA 137 04/26/2020 1141   K 4.3 04/26/2020 1141   CL 105 04/26/2020 1141   CO2 28 04/26/2020 1141   GLUCOSE 132 (H) 04/26/2020 1141   BUN 12 04/26/2020 1141   CREATININE 0.93 04/26/2020 1141   CREATININE 1.08 (H) 08/25/2019 1524   CALCIUM 10.2 04/26/2020 1141   PROT 7.4 04/26/2020 1141   ALBUMIN 3.9 04/26/2020 1141   AST 15 04/26/2020 1141   AST 19 08/25/2019 1524   ALT 12 04/26/2020 1141   ALT 18 08/25/2019 1524   ALKPHOS 47 04/26/2020 1141   BILITOT 0.4 04/26/2020 1141   BILITOT 0.4 08/25/2019 1524   GFRNONAA >60 04/26/2020 1141   GFRNONAA 49 (L) 08/25/2019 1524   GFRAA >60 02/16/2020 1444   GFRAA 57 (L) 08/25/2019 1524    No results found for: TOTALPROTELP, ALBUMINELP, A1GS, A2GS, BETS, BETA2SER, GAMS, MSPIKE, SPEI  Lab Results  Component Value Date   WBC 7.7 04/26/2020   NEUTROABS 4.9 04/26/2020   HGB 13.3 04/26/2020   HCT 40.1 04/26/2020   MCV 90.1 04/26/2020   PLT 223 04/26/2020    No results found for: LABCA2  No components found for: [id number]  No results for input(s): INR in the last 168 hours.  No results found for: LABCA2  No results found for: ALP379  No results found for: KWI097  No results found for: DZH299  No results found for: CA2729  No components found for: HGQUANT  No results found for: CEA1 / No results found for: CEA1   No results found for: AFPTUMOR  No results  found for: CHROMOGRNA  No results found for: KPAFRELGTCHN, LAMBDASER, KAPLAMBRATIO (kappa/lambda light chains)  No results found for: HGBA, HGBA2QUANT, HGBFQUANT, HGBSQUAN (Hemoglobinopathy evaluation)   No results found for: LDH  No results found for: IRON, TIBC, IRONPCTSAT (Iron and TIBC)  No results found for: FERRITIN  Urinalysis    Component Value Date/Time  COLORURINE YELLOW 12/02/2017 [place] 12/02/2017 1243   LABSPEC 1.014 12/02/2017 1243   PHURINE 6.0 12/02/2017 1243   GLUCOSEU NEGATIVE 12/02/2017 1243   HGBUR NEGATIVE 12/02/2017 1243   BILIRUBINUR NEGATIVE 12/02/2017 1243   KETONESUR NEGATIVE 12/02/2017 1243   PROTEINUR NEGATIVE 12/02/2017 1243   NITRITE NEGATIVE 12/02/2017 1243   LEUKOCYTESUR TRACE (A) 12/02/2017 1243    STUDIES: No results found.   ELIGIBLE FOR AVAILABLE RESEARCH PROTOCOL: AET  ASSESSMENT: 80 y.o. Story woman status post left breast lower inner quadrant biopsy 08/06/2019 for a clinical T1b N0, stage IA invasive lobular carcinoma, E-cadherin negative, grade 2, estrogen and progesterone receptor positive, HER-2 not amplified, with an MIB-1 of 5%  (1) status post left lumpectomy with no sentinel lymph node sampling 09/08/2019 for a pT1c pNX, stage IA invasive lobular breast cancer, grade 1, with close but negative margins.  (2) adjuvant radiation 11/09/2019 through 12/08/2019 Site Technique Total Dose (Gy) Dose per Fx (Gy) Completed Fx Beam Energies  Breast, Left: Breast_Lt 3D 40.05/40.05 2.67 15/15 6X, 10X  Breast, Left: Breast_Lt_Bst 3D 12/12 2 6/6 6X, 10X    (3) started anastrozole 10/31/2019  (a) bone density January 2022   PLAN:  Jacqueline Stephenson continues to do well on anastrozole and the plan will be to continue that a total of 5 years.  She is scheduled for mammography in January.  She will have a bone density at the same time.  I will then see her in February.  Ideally she would see her surgeon Dr. Brantley Stage 6  months after that, sometime in July or August.  She knows to call for any other issue that may develop before the next visit  Total encounter time 20 minutes.   Aurea Graff   08/15/2020 11:08 PM Medical Oncology and Hematology [facility], [place] [address] Tel. [phone]    Fax. [phone]   This document serves as a record of services personally performed by Lurline Del, MD. It was created on his behalf by Wilburn Mylar, a trained medical scribe. The creation of this record is based on the scribe's personal observations and the provider's statements to them.   I, Lurline Del MD, have reviewed the above documentation for accuracy and completeness, and I agree with the above.   *Total Encounter Time as defined by the Centers for Medicare and Medicaid Services includes, in addition to the face-to-face time of a patient visit (documented in the note above) non-face-to-face time: obtaining and reviewing outside history, ordering and reviewing medications, tests or procedures, care coordination (communications with other health care professionals or caregivers) and documentation in the medical record.

## 2020-08-16 ENCOUNTER — Inpatient Hospital Stay: Payer: Medicare Other | Admitting: Oncology

## 2020-08-16 ENCOUNTER — Inpatient Hospital Stay: Payer: Medicare Other

## 2020-08-18 ENCOUNTER — Other Ambulatory Visit: Payer: Self-pay

## 2020-08-18 ENCOUNTER — Telehealth: Payer: Self-pay | Admitting: Oncology

## 2020-08-18 ENCOUNTER — Encounter (INDEPENDENT_AMBULATORY_CARE_PROVIDER_SITE_OTHER): Payer: Medicare Other | Admitting: Ophthalmology

## 2020-08-18 DIAGNOSIS — H35033 Hypertensive retinopathy, bilateral: Secondary | ICD-10-CM

## 2020-08-18 DIAGNOSIS — H348312 Tributary (branch) retinal vein occlusion, right eye, stable: Secondary | ICD-10-CM | POA: Diagnosis not present

## 2020-08-18 DIAGNOSIS — I10 Essential (primary) hypertension: Secondary | ICD-10-CM

## 2020-08-18 DIAGNOSIS — D3132 Benign neoplasm of left choroid: Secondary | ICD-10-CM

## 2020-08-18 DIAGNOSIS — H43813 Vitreous degeneration, bilateral: Secondary | ICD-10-CM

## 2020-08-18 NOTE — Telephone Encounter (Signed)
R/s appt per 2/15 sch msg - left message for patient with new apt date and time

## 2020-08-19 ENCOUNTER — Telehealth: Payer: Self-pay

## 2020-08-19 DIAGNOSIS — M48062 Spinal stenosis, lumbar region with neurogenic claudication: Secondary | ICD-10-CM | POA: Diagnosis not present

## 2020-08-19 DIAGNOSIS — M5136 Other intervertebral disc degeneration, lumbar region: Secondary | ICD-10-CM | POA: Diagnosis not present

## 2020-08-19 NOTE — Telephone Encounter (Signed)
Contacted pt and confirmed dates for mammogram and next [facility] appointments. Pt verified she is aware.

## 2020-08-25 ENCOUNTER — Inpatient Hospital Stay: Payer: Medicare Other | Admitting: Oncology

## 2020-08-25 ENCOUNTER — Inpatient Hospital Stay: Payer: Medicare Other

## 2020-08-30 ENCOUNTER — Other Ambulatory Visit: Payer: Self-pay

## 2020-08-30 ENCOUNTER — Ambulatory Visit
Admission: RE | Admit: 2020-08-30 | Discharge: 2020-08-30 | Disposition: A | Discharge status: Home / Self Care | Payer: Medicare Other | Source: Ambulatory Visit | Attending: Oncology | Admitting: Oncology

## 2020-08-30 DIAGNOSIS — R922 Inconclusive mammogram: Secondary | ICD-10-CM | POA: Diagnosis not present

## 2020-08-30 DIAGNOSIS — C50312 Malignant neoplasm of lower-inner quadrant of left female breast: Secondary | ICD-10-CM

## 2020-09-14 NOTE — Progress Notes (Signed)
[place]  [phone]) [phone] Fax:(336) [phone]     ID: Jacqueline Stephenson DOB: 1941/02/11  MR#: [id number]  PPJ#:[id number]  Patient Care Team: Gaynelle Arabian, MD as PCP - General (Family Medicine) Erroll Luna, MD as Consulting Physician (General Surgery) Ocie Stanzione, Virgie Dad, MD as Consulting Physician (Oncology) Marylynn Pearson, MD as Consulting Physician (Obstetrics and Gynecology) Jacqueline Pray, MD as Consulting Physician (Radiation Oncology) Mauro Kaufmann, RN as Oncology Nurse Navigator Rockwell Germany, RN as Oncology Nurse Navigator Justice Britain, MD as Consulting Physician (Orthopedic Surgery) Chauncey Cruel, MD OTHER MD:   CHIEF COMPLAINT: Estrogen receptor positive lobular breast cancer  CURRENT TREATMENT: Anastrozole   INTERVAL HISTORY: Jacqueline Stephenson returns today for follow up of her estrogen receptor positive lobular breast cancer accompanied by her husband  She continues on anastrozole for her lobular breast cancer, with good tolerance.  Hot flashes are rare.  Vaginal dryness is not an issue.  She obtains the drug at a very good price.  Since her last visit, she underwent bone density screening on 06/10/2020 showing a T-score of -1.3, which is minimally osteopenic.  She also underwent bilateral diagnostic mammography with tomography at [facility] on 08/30/2020 showing: breast density category B; no evidence of malignancy in either breast.   REVIEW OF SYSTEMS: [name] continues to do the stretching exercises for her right upper extremity.  She is still very limited there.  She has significant pain in the left shoulder area and she could barely get into position at the time of mammography.  She tells me her left shoulder will get done at some point by Dr. Onnie Graham but she needs to get the right shoulder more rehab first.  Aside from these issues a detailed review of systems today was stable   COVID 19 VACCINATION STATUS: Jacqueline Stephenson x2, most  recently 08/2019   HISTORY OF CURRENT ILLNESS: From the original intake note:  Jacqueline Stephenson presented for her annual mammogram at [facility] on 07/30/2019 with left nipple inversion. She denied palpating any masses. She underwent bilateral diagnostic mammography with tomography and left breast ultrasonography showing: breast density category B; suspicious 0.9 cm mass at 6-7 o'clock in the left breast; 3 mm benign cyst in the outer retroareolar left breast at 3 o'clock; left axilla negative for lymphadenopathy.  Accordingly on 08/06/2019 she proceeded to biopsy of the left breast area in question. The pathology from this procedure ([id number]) showed: invasive mammary carcinoma, grade 2, e-cadherin negative. Prognostic indicators significant for: estrogen receptor, 95% positive and progesterone receptor, 30% positive, both with strong staining intensity. Proliferation marker Ki67 at 5%. HER2 equivocal by immunohistochemistry (2+), but negative by fluorescent in situ hybridization with a signals ratio 1.4 and number per cell 1.75.  She underwent breast MRI yesterday, 08/24/2019, which showed: No suspicious masses in the right breast no abnormal appearing lymph nodes complex mass in the lower outer left breast which is postbiopsy change and hematoma primarily.  She has an appointment with Dr. Sondra Come 08/26/2019.  She is scheduled for left lumpectomy on 09/08/2019 under Dr. Brantley Stage.  The patient's subsequent history is as detailed below.   PAST MEDICAL HISTORY: Past Medical History:  Diagnosis Date  . Arthritis   . Bladder spasms   . Breast cancer ([place]) 2021   Left  . Cataract    Left  . Diabetes mellitus without complication ([place])    Diet controlled  . GERD (gastroesophageal reflux disease)   . Heart murmur    in  childhood, no problems  . High cholesterol   . Hypertension   . Shingles   . Vertigo     PAST SURGICAL HISTORY: Past Surgical History:  Procedure Laterality Date   . BREAST EXCISIONAL BIOPSY    . BREAST LUMPECTOMY WITH RADIOACTIVE SEED LOCALIZATION Left 09/08/2019   Procedure: LEFT BREAST LUMPECTOMY WITH RADIOACTIVE SEED LOCALIZATION;  Surgeon: Erroll Luna, MD;  Location: [facility];  Service: General;  Laterality: Left;  . CATARACT EXTRACTION W/ INTRAOCULAR LENS IMPLANT Right   . COLONOSCOPY    . REVERSE SHOULDER ARTHROPLASTY Right 02/23/2020   Procedure: REVERSE SHOULDER ARTHROPLASTY;  Surgeon: Justice Britain, MD;  Location: [facility];  Service: Orthopedics;  Laterality: Right;  170mn  . TRIGGER FINGER RELEASE Left     FAMILY HISTORY: Family History  Problem Relation Age of Onset  . Heart disease Mother   . Heart disease Father   Hana's father died in his late 631sfrom heart disease.  He was a smoker.  The patient's mother died at age 7624also from heart disease.  She was not a smoker.  There is no breast ovarian pancreatic or prostate cancer in the family to the patient's knowledge.  The patient has no brothers or sisters.   GYNECOLOGIC HISTORY:  No LMP recorded. Patient is postmenopausal. Menarche: 80years old Age at first live birth: [age]years old GBallyP 1 LMP late 417sContraceptive: Oral contraceptives less than 1 year HRT no  Hysterectomy? no BSO?  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  They have been married more than 50 years.  Their child GMarya Stephenson 89years old, [place] [employer]  DMarguerite Oleahas a child from an earlier marriage.  The patient has 2 grandchildren.  She attends a local [place]ES: In the absence of any documentation to the contrary, the patient's spouse is their HCPOA.    HEALTH MAINTENANCE: Social History   Tobacco Use  . Smoking status: Never Smoker  . Smokeless tobacco: Never Used  Vaping Use  . Vaping Use:  Never used  Substance Use Topics  . Alcohol use: Yes    Comment: Socially, rare  . Drug use: Never     Colonoscopy: [facility]  PAP: 2021  Bone density: "Normal" per patient   No Known Allergies  Current Outpatient Medications  Medication Sig Dispense Refill  . acetaminophen (TYLENOL) 500 MG tablet Take 500 mg by mouth at bedtime.    .Marland Kitchenanastrozole (ARIMIDEX) 1 MG tablet Take 1 tablet (1 mg total) by mouth daily. 90 tablet 4  . aspirin EC 81 MG tablet Take 81 mg by mouth daily.    . calcium carbonate (TUMS - DOSED IN MG ELEMENTAL CALCIUM) 500 MG chewable tablet Chew 2-4 tablets by mouth as needed for indigestion or heartburn.    . Cholecalciferol (VITAMIN D) 50 MCG (2000 UT) tablet Take 2,000 Units by mouth daily.     . cyclobenzaprine (FLEXERIL) 10 MG tablet Take 1 tablet (10 mg total) by mouth 3 (three) times daily as needed for muscle spasms. 30 tablet 1  . diclofenac (VOLTAREN) 50 MG EC tablet TAKE 1 TABLET BY MOUTH 2 TIMES DAILY AS NEEDED. 60 tablet 1  . diclofenac Sodium (VOLTAREN) 1 % GEL Apply 2 g topically daily as needed (pain).     .Marland Kitchen  lisinopril (PRINIVIL,ZESTRIL) 20 MG tablet Take 20 mg by mouth daily.     . Multiple Vitamin (MULTIVITAMIN WITH MINERALS) TABS tablet Take 1 tablet by mouth daily.    . ondansetron (ZOFRAN) 4 MG tablet Take 1 tablet (4 mg total) by mouth every 8 (eight) hours as needed for nausea or vomiting. 20 tablet 0  . oxyCODONE-acetaminophen (PERCOCET) 5-325 MG tablet Take 1 tablet by mouth every 4 (four) hours as needed (max 6 q). 20 tablet 0  . promethazine (PHENERGAN) 25 MG tablet Take 1 tablet (25 mg total) by mouth every 6 (six) hours as needed for nausea or vomiting (not controlled by zofran). 15 tablet 0  . rosuvastatin (CRESTOR) 10 MG tablet Take 10 mg by mouth every evening.  0  . tolterodine (DETROL LA) 4 MG 24 hr capsule Take 4 mg by mouth daily.     No current facility-administered medications for this visit.    OBJECTIVE:  white woman who appears  stated age  80:   09/15/20 1304  BP: (!) 151/66  Pulse: 75  Resp: 18  Temp: (!) 97.2 F (36.2 C)  SpO2: 97%     Body mass index is 25 kg/m.   Wt Readings from Last 3 Encounters:  09/15/20 164 lb 6.4 oz (74.6 kg)  04/26/20 165 lb 6.4 oz (75 kg)  02/23/20 170 lb 7 oz (77.3 kg)      ECOG FS:1 - Symptomatic but completely ambulatory  Sclerae unicteric, EOMs intact Wearing a mask No cervical or supraclavicular adenopathy Lungs no rales or rhonchi Heart regular rate and rhythm Abd soft, nontender, positive bowel sounds MSK no focal spinal tenderness, no upper extremity lymphedema Neuro: nonfocal, well oriented, appropriate affect Breasts: The right breast is unremarkable.  The left breast is status post lumpectomy and radiation with an excellent cosmetic result.  There is no evidence of residual or recurrent disease.  Both axillae are benign.   LAB RESULTS:  CMP     Component Value Date/Time   NA 140 09/15/2020 1225   K 4.4 09/15/2020 1225   CL 104 09/15/2020 1225   CO2 29 09/15/2020 1225   GLUCOSE 117 (H) 09/15/2020 1225   BUN 16 09/15/2020 1225   CREATININE 0.93 09/15/2020 1225   CREATININE 1.08 (H) 08/25/2019 1524   CALCIUM 10.2 09/15/2020 1225   PROT 7.4 09/15/2020 1225   ALBUMIN 3.8 09/15/2020 1225   AST 16 09/15/2020 1225   AST 19 08/25/2019 1524   ALT 16 09/15/2020 1225   ALT 18 08/25/2019 1524   ALKPHOS 48 09/15/2020 1225   BILITOT 0.4 09/15/2020 1225   BILITOT 0.4 08/25/2019 1524   GFRNONAA >60 09/15/2020 1225   GFRNONAA 49 (L) 08/25/2019 1524   GFRAA >60 02/16/2020 1444   GFRAA 57 (L) 08/25/2019 1524    No results found for: TOTALPROTELP, ALBUMINELP, A1GS, A2GS, BETS, BETA2SER, GAMS, MSPIKE, SPEI  Lab Results  Component Value Date   WBC 6.7 09/15/2020   NEUTROABS 4.1 09/15/2020   HGB 13.2 09/15/2020   HCT 39.6 09/15/2020   MCV 90.8 09/15/2020   PLT 224 09/15/2020    No results found for: LABCA2  No components found for: [id number]  No  results for input(s): INR in the last 168 hours.  No results found for: LABCA2  No results found for: EPP295  No results found for: JOA416  No results found for: SAY301  No results found for: CA2729  No components found for: HGQUANT  No results found for: CEA1 /  No results found for: CEA1   No results found for: AFPTUMOR  No results found for: CHROMOGRNA  No results found for: KPAFRELGTCHN, LAMBDASER, KAPLAMBRATIO (kappa/lambda light chains)  No results found for: HGBA, HGBA2QUANT, HGBFQUANT, HGBSQUAN (Hemoglobinopathy evaluation)   No results found for: LDH  No results found for: IRON, TIBC, IRONPCTSAT (Iron and TIBC)  No results found for: FERRITIN  Urinalysis    Component Value Date/Time   COLORURINE YELLOW 12/02/2017 [place] 12/02/2017 1243   LABSPEC 1.014 12/02/2017 1243   PHURINE 6.0 12/02/2017 [place] 12/02/2017 [place] 12/02/2017 [place] 12/02/2017 1243   KETONESUR NEGATIVE 12/02/2017 1243   PROTEINUR NEGATIVE 12/02/2017 1243   NITRITE NEGATIVE 12/02/2017 1243   LEUKOCYTESUR TRACE (A) 12/02/2017 1243    STUDIES: MM DIAG BREAST TOMO BILATERAL  Result Date: 08/30/2020 CLINICAL DATA:  80 year old female presenting for routine annual surveillance status post left breast lumpectomy in 2021. EXAM: DIGITAL DIAGNOSTIC BILATERAL MAMMOGRAM WITH TOMOSYNTHESIS AND CAD TECHNIQUE: Bilateral digital diagnostic mammography and breast tomosynthesis was performed. The images were evaluated with computer-aided detection. COMPARISON:  Previous exam(s). ACR Breast Density Category b: There are scattered areas of fibroglandular density. FINDINGS: The patient has limited mobility of the left shoulder, and therefore the pectoralis muscle could not be included on the left MLO view, and the spot compression magnification images over the lumpectomy site could not be performed. Expected surgical changes are noted at  the lumpectomy site in the inferior posterior left breast. No suspicious calcifications, masses or areas of distortion are seen in the bilateral breasts. IMPRESSION: 1. Expected surgical changes at the left breast lumpectomy site. No mammographic evidence of malignancy in the bilateral breasts. RECOMMENDATION: Diagnostic mammogram is suggested in 1 year. (Code:[id number]-B-01Y) I have discussed the findings and recommendations with the patient. If applicable, a reminder letter will be sent to the patient regarding the next appointment. BI-RADS CATEGORY  2: Benign. Electronically Signed   By: Ammie Ferrier M.D.   On: 08/30/2020 12:30     ELIGIBLE FOR AVAILABLE RESEARCH PROTOCOL: AET  ASSESSMENT: 80 y.o. [place] woman status post left breast lower inner quadrant biopsy 08/06/2019 for a clinical T1b N0, stage IA invasive lobular carcinoma, E-cadherin negative, grade 2, estrogen and progesterone receptor positive, HER-2 not amplified, with an MIB-1 of 5%  (1) status post left lumpectomy with no sentinel lymph node sampling 09/08/2019 for a pT1c pNX, stage IA invasive lobular breast cancer, grade 1, with close but negative margins.  (2) adjuvant radiation 11/09/2019 through 12/08/2019 Site Technique Total Dose (Gy) Dose per Fx (Gy) Completed Fx Beam Energies  Breast, Left: Breast_Lt 3D 40.05/40.05 2.67 15/15 6X, 10X  Breast, Left: Breast_Lt_Bst 3D 12/12 2 6/6 6X, 10X    (3) started anastrozole 10/31/2019  (a) bone density 06/10/2020 showed a T score of -1.3   PLAN:  Cissy is now a year out from definitive surgery for her breast cancer with no evidence of disease recurrence.  This is favorable.  She is tolerating anastrozole well and the plan is to continue that a total of 5 years.  Her bone density shows minimal osteopenia.  She is already on vitamin D supplementation.  She is about to start a walking program walking a minimal amount most days.  She saw Dr. Brantley Stage in February she tells me.  We  will see her in August of this year and then yearly thereafter until she completes her 5 years of follow-up  She knows to call for any other issue that may develop before the next visit  Total encounter time 25 minutes.Chauncey Cruel, MD   09/15/2020 1:19 PM Medical Oncology and Hematology [facility], [place] [address] Tel. [phone]    Fax. [phone]   This document serves as a record of services personally performed by Lurline Del, MD. It was created on his behalf by Wilburn Mylar, a trained medical scribe. The creation of this record is based on the scribe's personal observations and the provider's statements to them.   I, Lurline Del MD, have reviewed the above documentation for accuracy and completeness, and I agree with the above.   *Total Encounter Time as defined by the Centers for Medicare and Medicaid Services includes, in addition to the face-to-face time of a patient visit (documented in the note above) non-face-to-face time: obtaining and reviewing outside history, ordering and reviewing medications, tests or procedures, care coordination (communications with other health care professionals or caregivers) and documentation in the medical record.

## 2020-09-15 ENCOUNTER — Inpatient Hospital Stay (HOSPITAL_BASED_OUTPATIENT_CLINIC_OR_DEPARTMENT_OTHER): Payer: Medicare Other | Admitting: Oncology

## 2020-09-15 ENCOUNTER — Other Ambulatory Visit: Payer: Self-pay

## 2020-09-15 ENCOUNTER — Inpatient Hospital Stay: Payer: Medicare Other | Attending: Oncology

## 2020-09-15 VITALS — BP 151/66 | HR 75 | Temp 97.2°F | Resp 18 | Ht 68.0 in | Wt 164.4 lb

## 2020-09-15 DIAGNOSIS — C50312 Malignant neoplasm of lower-inner quadrant of left female breast: Secondary | ICD-10-CM | POA: Insufficient documentation

## 2020-09-15 DIAGNOSIS — K219 Gastro-esophageal reflux disease without esophagitis: Secondary | ICD-10-CM | POA: Diagnosis not present

## 2020-09-15 DIAGNOSIS — I10 Essential (primary) hypertension: Secondary | ICD-10-CM | POA: Diagnosis not present

## 2020-09-15 DIAGNOSIS — Z7982 Long term (current) use of aspirin: Secondary | ICD-10-CM | POA: Diagnosis not present

## 2020-09-15 DIAGNOSIS — E1136 Type 2 diabetes mellitus with diabetic cataract: Secondary | ICD-10-CM | POA: Diagnosis not present

## 2020-09-15 DIAGNOSIS — Z170 Estrogen receptor positive status [ER+]: Secondary | ICD-10-CM | POA: Diagnosis not present

## 2020-09-15 DIAGNOSIS — M8580 Other specified disorders of bone density and structure, unspecified site: Secondary | ICD-10-CM | POA: Diagnosis not present

## 2020-09-15 DIAGNOSIS — Z79811 Long term (current) use of aromatase inhibitors: Secondary | ICD-10-CM | POA: Insufficient documentation

## 2020-09-15 DIAGNOSIS — Z79899 Other long term (current) drug therapy: Secondary | ICD-10-CM | POA: Insufficient documentation

## 2020-09-15 DIAGNOSIS — E7800 Pure hypercholesterolemia, unspecified: Secondary | ICD-10-CM | POA: Diagnosis not present

## 2020-09-15 LAB — CBC WITH DIFFERENTIAL/PLATELET
Abs Immature Granulocytes: 0.01 10*3/uL (ref 0.00–0.07)
Basophils Absolute: 0 10*3/uL (ref 0.0–0.1)
Basophils Relative: 1 %
Eosinophils Absolute: 0.3 10*3/uL (ref 0.0–0.5)
Eosinophils Relative: 5 %
HCT: 39.6 % (ref 36.0–46.0)
Hemoglobin: 13.2 g/dL (ref 12.0–15.0)
Immature Granulocytes: 0 %
Lymphocytes Relative: 26 %
Lymphs Abs: 1.7 10*3/uL (ref 0.7–4.0)
MCH: 30.3 pg (ref 26.0–34.0)
MCHC: 33.3 g/dL (ref 30.0–36.0)
MCV: 90.8 fL (ref 80.0–100.0)
Monocytes Absolute: 0.5 10*3/uL (ref 0.1–1.0)
Monocytes Relative: 8 %
Neutro Abs: 4.1 10*3/uL (ref 1.7–7.7)
Neutrophils Relative %: 60 %
Platelets: 224 10*3/uL (ref 150–400)
RBC: 4.36 MIL/uL (ref 3.87–5.11)
RDW: 12.8 % (ref 11.5–15.5)
WBC: 6.7 10*3/uL (ref 4.0–10.5)
nRBC: 0 % (ref 0.0–0.2)

## 2020-09-15 LAB — COMPREHENSIVE METABOLIC PANEL
ALT: 16 U/L (ref 0–44)
AST: 16 U/L (ref 15–41)
Albumin: 3.8 g/dL (ref 3.5–5.0)
Alkaline Phosphatase: 48 U/L (ref 38–126)
Anion gap: 7 (ref 5–15)
BUN: 16 mg/dL (ref 8–23)
CO2: 29 mmol/L (ref 22–32)
Calcium: 10.2 mg/dL (ref 8.9–10.3)
Chloride: 104 mmol/L (ref 98–111)
Creatinine, Ser: 0.93 mg/dL (ref 0.44–1.00)
GFR, Estimated: >60 mL/min (ref >60)
Glucose, Bld: 117 mg/dL — ABNORMAL HIGH (ref 70–99)
Potassium: 4.4 mmol/L (ref 3.5–5.1)
Sodium: 140 mmol/L (ref 135–145)
Total Bilirubin: 0.4 mg/dL (ref 0.3–1.2)
Total Protein: 7.4 g/dL (ref 6.5–8.1)

## 2020-09-16 ENCOUNTER — Telehealth: Payer: Self-pay | Admitting: Oncology

## 2020-09-16 NOTE — Telephone Encounter (Signed)
Scheduled appts per 3/17 los. Called pt, no answer. Left msg with appts date and times.

## 2020-09-19 ENCOUNTER — Telehealth: Payer: Self-pay

## 2020-09-19 NOTE — Telephone Encounter (Signed)
Contacted pt regarding "diagram" she wanted from Dr Jana Hakim. Explained to pt that he was going to write out how to use Miralx and then to use a stool softener twice a day. Reviewed with pt and she verbalized understanding.

## 2020-09-19 NOTE — Progress Notes (Signed)
Pt stated at her visit last week Dr Jana Hakim was supposed to draw her a diagram re: constipation. Pt stated she forgot to remind him and wanted to see if her will draw out diagram so she can come by and pick it up.Will notify MD of request.

## 2020-10-28 DIAGNOSIS — E559 Vitamin D deficiency, unspecified: Secondary | ICD-10-CM | POA: Diagnosis not present

## 2020-10-28 DIAGNOSIS — I10 Essential (primary) hypertension: Secondary | ICD-10-CM | POA: Diagnosis not present

## 2020-10-28 DIAGNOSIS — E7800 Pure hypercholesterolemia, unspecified: Secondary | ICD-10-CM | POA: Diagnosis not present

## 2020-10-28 DIAGNOSIS — H9190 Unspecified hearing loss, unspecified ear: Secondary | ICD-10-CM | POA: Diagnosis not present

## 2020-10-28 DIAGNOSIS — K5900 Constipation, unspecified: Secondary | ICD-10-CM | POA: Diagnosis not present

## 2020-10-28 DIAGNOSIS — E1169 Type 2 diabetes mellitus with other specified complication: Secondary | ICD-10-CM | POA: Diagnosis not present

## 2020-10-28 DIAGNOSIS — C50919 Malignant neoplasm of unspecified site of unspecified female breast: Secondary | ICD-10-CM | POA: Diagnosis not present

## 2020-10-28 DIAGNOSIS — M899 Disorder of bone, unspecified: Secondary | ICD-10-CM | POA: Diagnosis not present

## 2020-11-16 DIAGNOSIS — Z01419 Encounter for gynecological examination (general) (routine) without abnormal findings: Secondary | ICD-10-CM | POA: Diagnosis not present

## 2020-11-16 DIAGNOSIS — Z6829 Body mass index (BMI) 29.0-29.9, adult: Secondary | ICD-10-CM | POA: Diagnosis not present

## 2020-11-25 DIAGNOSIS — Z974 Presence of external hearing-aid: Secondary | ICD-10-CM | POA: Diagnosis not present

## 2020-11-25 DIAGNOSIS — H61893 Other specified disorders of external ear, bilateral: Secondary | ICD-10-CM | POA: Diagnosis not present

## 2020-11-25 DIAGNOSIS — H6123 Impacted cerumen, bilateral: Secondary | ICD-10-CM | POA: Diagnosis not present

## 2020-12-14 DIAGNOSIS — Z23 Encounter for immunization: Secondary | ICD-10-CM | POA: Diagnosis not present

## 2021-01-06 ENCOUNTER — Telehealth: Payer: Self-pay | Admitting: Oncology

## 2021-01-06 NOTE — Telephone Encounter (Signed)
Scheduled appointment per provider. Patient is aware. 

## 2021-01-13 DIAGNOSIS — L570 Actinic keratosis: Secondary | ICD-10-CM | POA: Diagnosis not present

## 2021-01-13 DIAGNOSIS — Z85828 Personal history of other malignant neoplasm of skin: Secondary | ICD-10-CM | POA: Diagnosis not present

## 2021-01-13 DIAGNOSIS — L814 Other melanin hyperpigmentation: Secondary | ICD-10-CM | POA: Diagnosis not present

## 2021-01-13 DIAGNOSIS — R202 Paresthesia of skin: Secondary | ICD-10-CM | POA: Diagnosis not present

## 2021-01-13 DIAGNOSIS — L578 Other skin changes due to chronic exposure to nonionizing radiation: Secondary | ICD-10-CM | POA: Diagnosis not present

## 2021-01-13 DIAGNOSIS — D225 Melanocytic nevi of trunk: Secondary | ICD-10-CM | POA: Diagnosis not present

## 2021-01-13 DIAGNOSIS — L821 Other seborrheic keratosis: Secondary | ICD-10-CM | POA: Diagnosis not present

## 2021-02-21 ENCOUNTER — Other Ambulatory Visit: Payer: Self-pay | Admitting: Oncology

## 2021-02-28 ENCOUNTER — Ambulatory Visit: Payer: Medicare Other | Admitting: Oncology

## 2021-02-28 ENCOUNTER — Other Ambulatory Visit: Payer: Medicare Other

## 2021-03-09 ENCOUNTER — Inpatient Hospital Stay: Payer: Medicare Other | Admitting: Oncology

## 2021-03-09 ENCOUNTER — Inpatient Hospital Stay: Payer: Medicare Other

## 2021-03-10 IMAGING — MG MM BREAST LOCALIZATION CLIP
4 series · 4 of 12 positions shown · non-contrast
Comparison: Previous exam(s).

CLINICAL DATA: Status post ultrasound-guided biopsy of a LEFT
breast mass.

EXAM:
DIAGNOSTIC LEFT MAMMOGRAM POST ULTRASOUND BIOPSY

[L ML synth-2D]
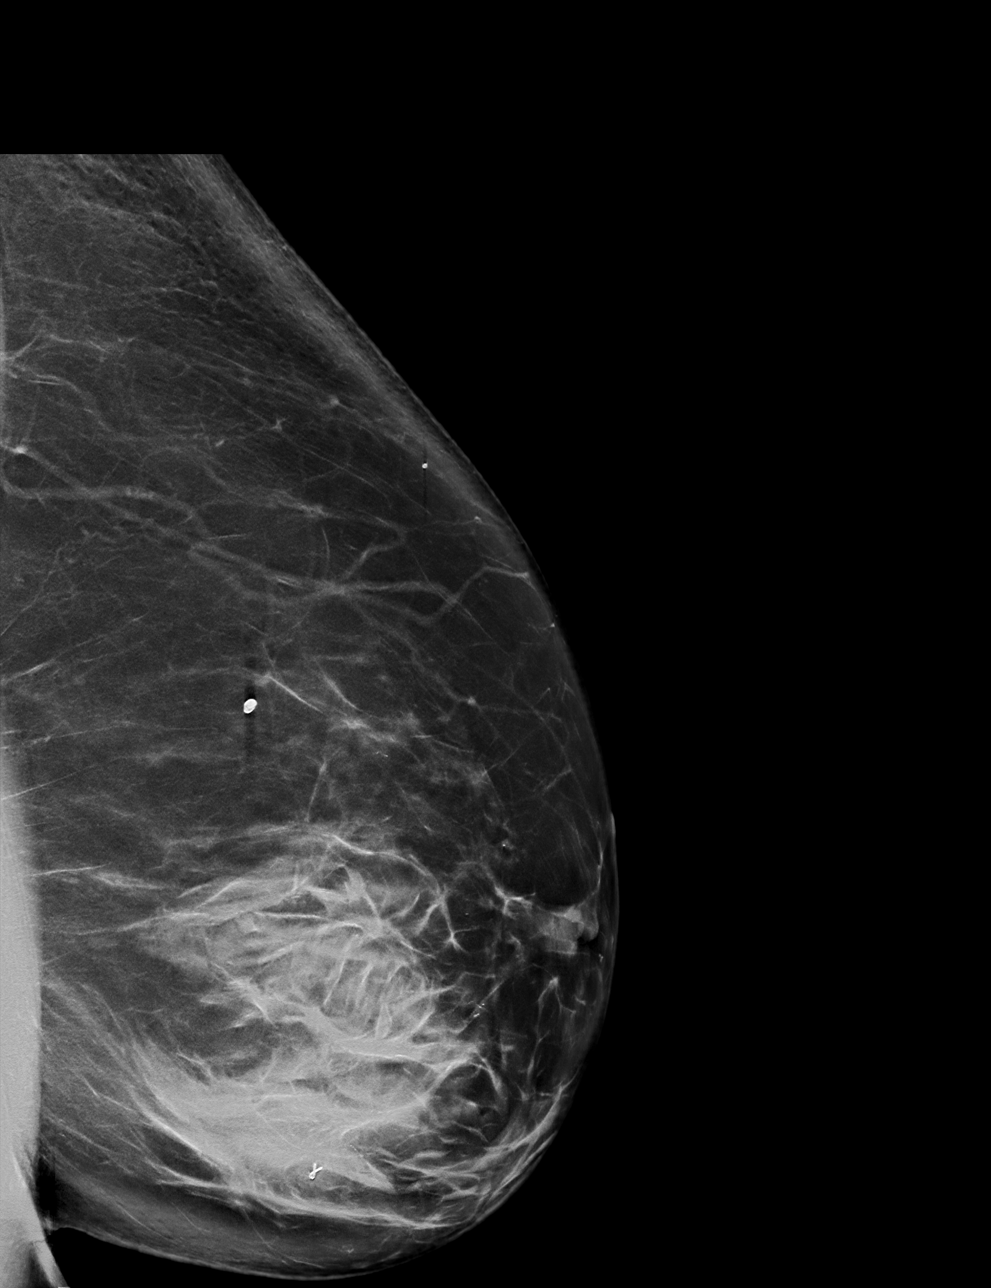

[L CC synth-2D]
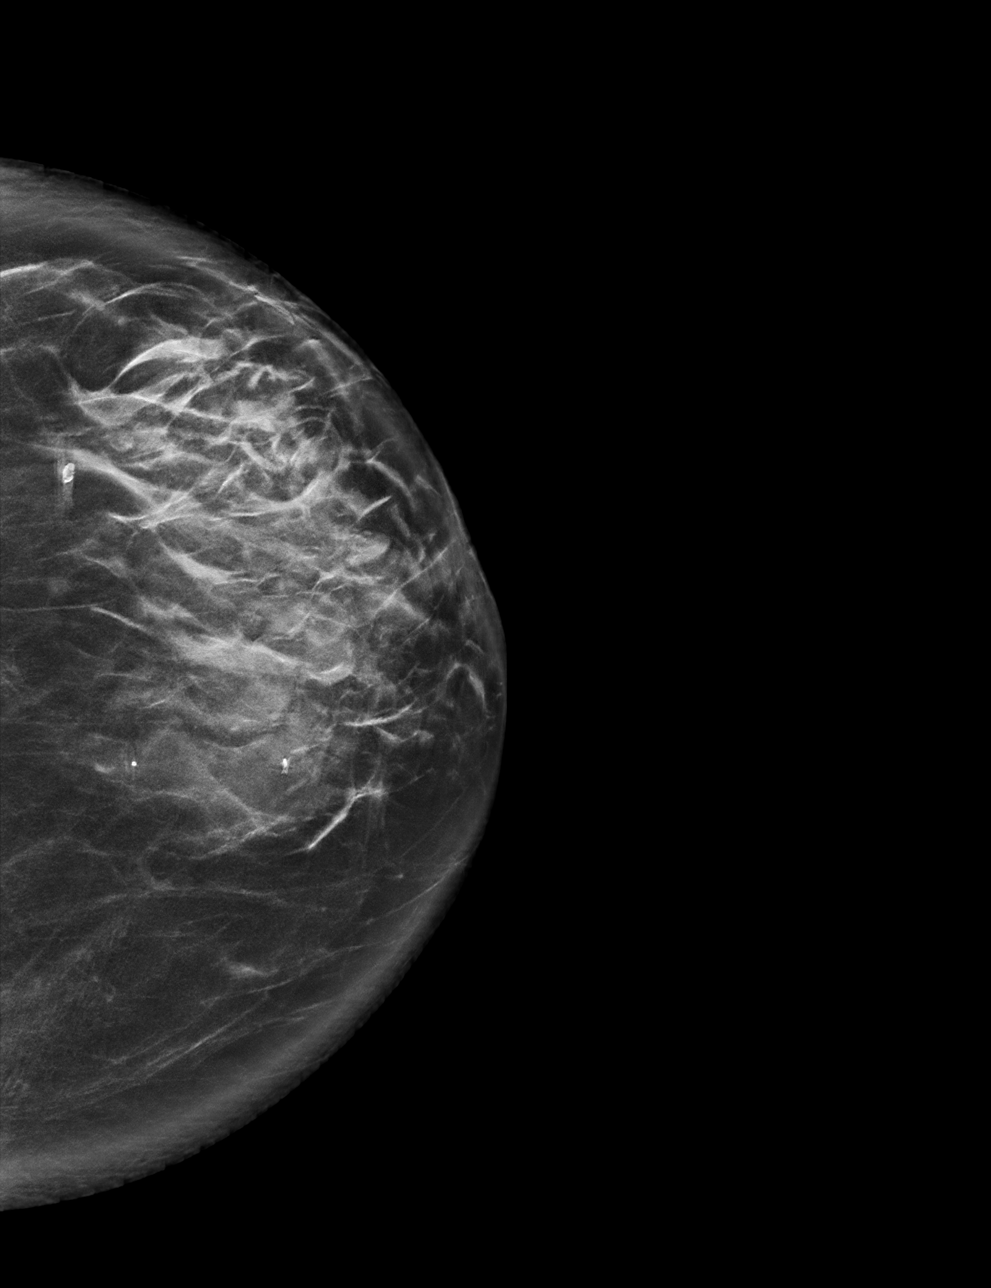

[L CC tomo · tomo slice 42/83.0]
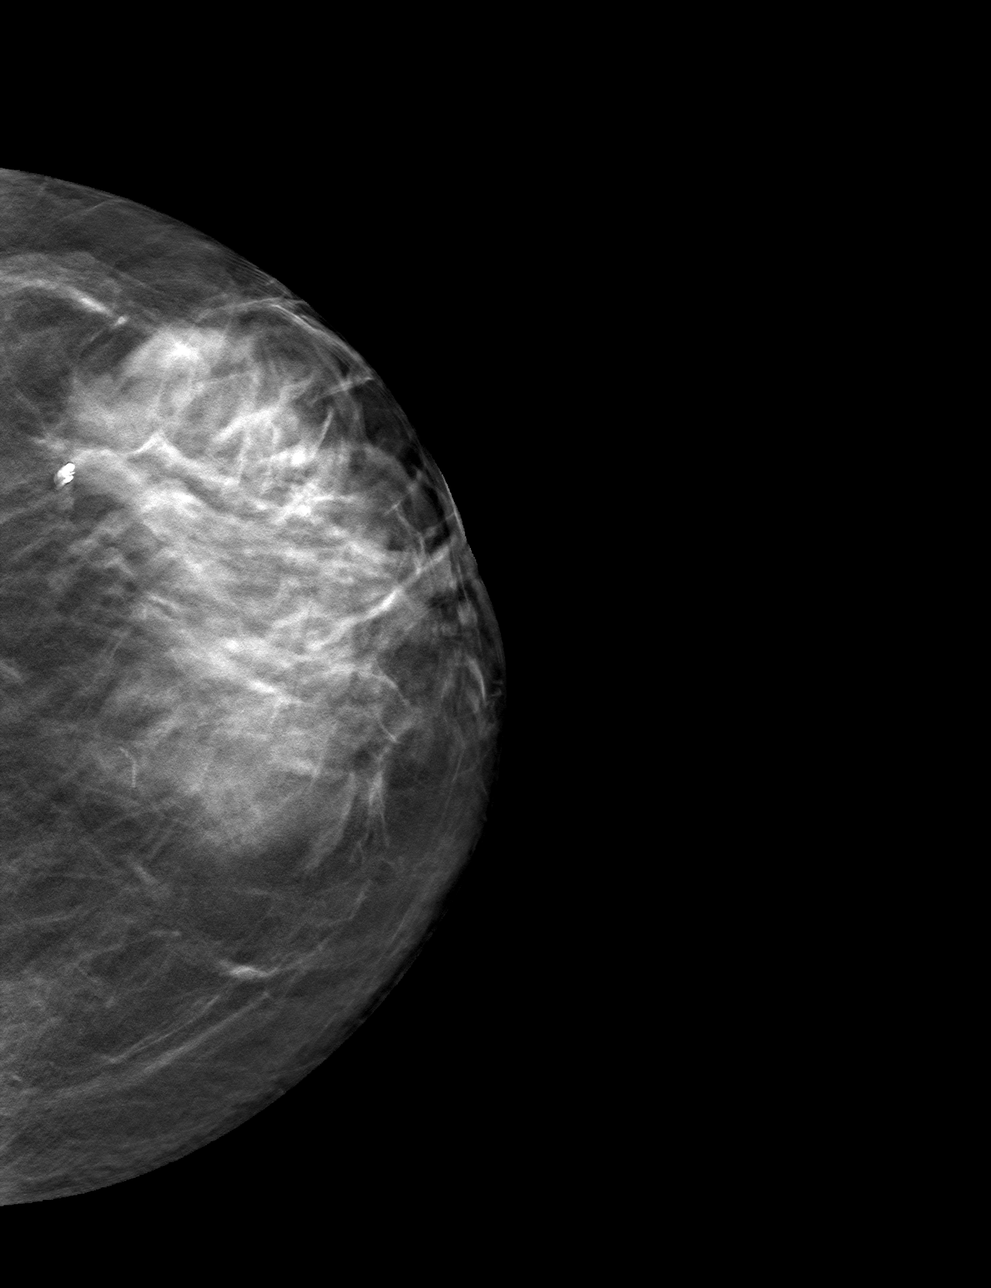

[L ML tomo · tomo slice 45/88.0]
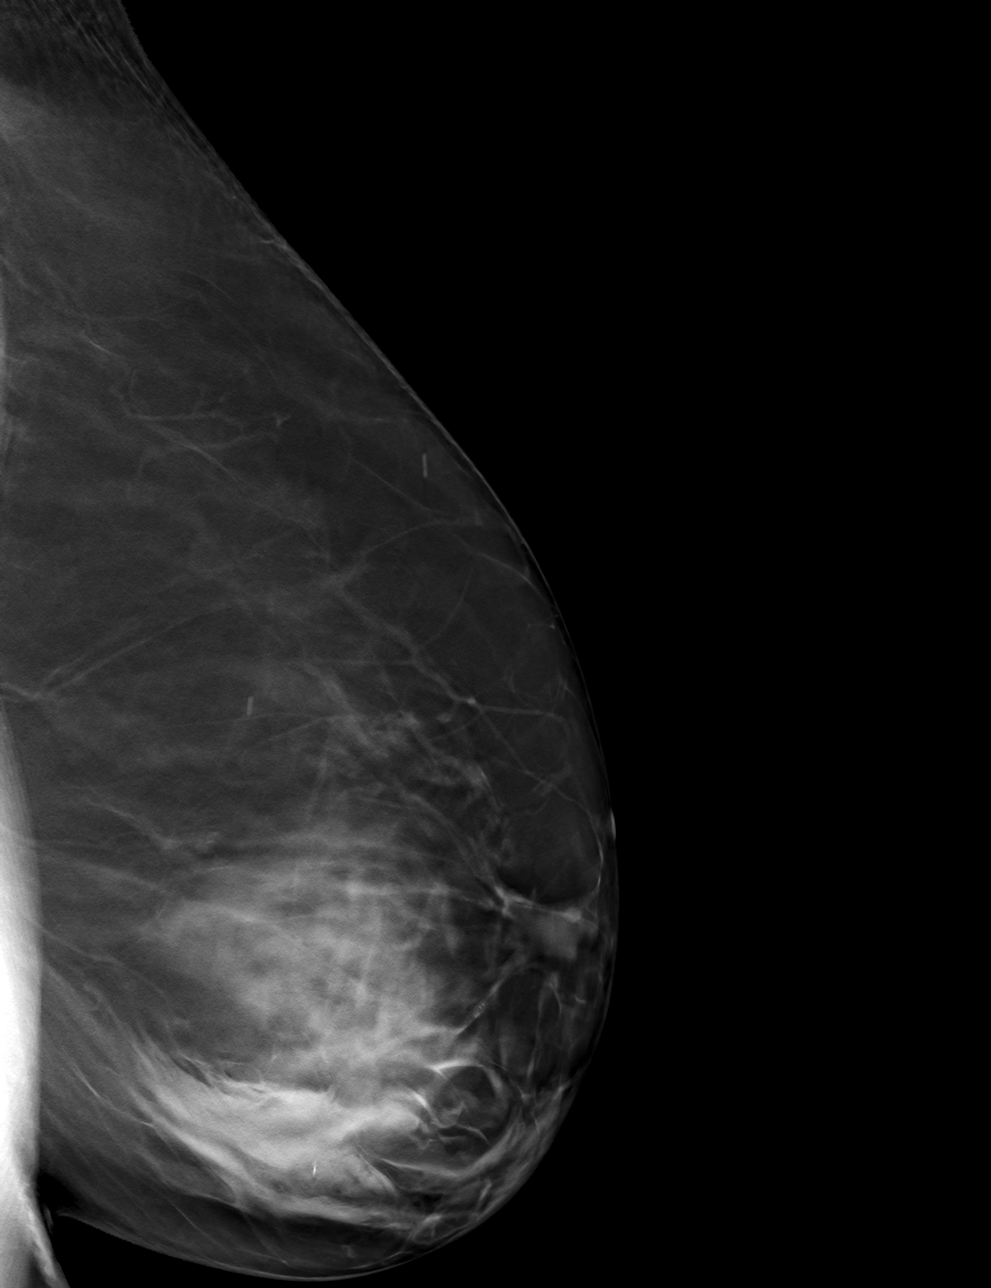

[4 of 12 positions shown; findings below may reference images not displayed]

FINDINGS: Mammographic images were obtained following ultrasound guided biopsy
of the LEFT breast mass at the 6:30 o'clock axis. The biopsy marking
clip is in expected position at the site of biopsy.
IMPRESSION: Appropriate positioning of the ribbon shaped biopsy marking clip at
the site of biopsy in the lower inner quadrant of the LEFT breast

Patient has a large post biopsy hematoma. Compression bandages were
applied.

Final Assessment: Post Procedure Mammograms for Marker Placement

## 2021-03-22 ENCOUNTER — Inpatient Hospital Stay: Payer: Medicare Other

## 2021-03-22 ENCOUNTER — Inpatient Hospital Stay: Payer: Medicare Other | Admitting: Oncology

## 2021-03-28 DIAGNOSIS — Z23 Encounter for immunization: Secondary | ICD-10-CM | POA: Diagnosis not present

## 2021-03-28 DIAGNOSIS — L570 Actinic keratosis: Secondary | ICD-10-CM | POA: Diagnosis not present

## 2021-03-28 DIAGNOSIS — T50905S Adverse effect of unspecified drugs, medicaments and biological substances, sequela: Secondary | ICD-10-CM | POA: Diagnosis not present

## 2021-03-29 ENCOUNTER — Inpatient Hospital Stay (HOSPITAL_BASED_OUTPATIENT_CLINIC_OR_DEPARTMENT_OTHER): Payer: Medicare Other | Admitting: Oncology

## 2021-03-29 ENCOUNTER — Inpatient Hospital Stay: Payer: Medicare Other | Attending: Oncology

## 2021-03-29 ENCOUNTER — Other Ambulatory Visit: Payer: Self-pay

## 2021-03-29 VITALS — BP 156/91 | HR 83 | Temp 97.4°F | Resp 18 | Wt 159.4 lb

## 2021-03-29 DIAGNOSIS — K219 Gastro-esophageal reflux disease without esophagitis: Secondary | ICD-10-CM | POA: Diagnosis not present

## 2021-03-29 DIAGNOSIS — M8580 Other specified disorders of bone density and structure, unspecified site: Secondary | ICD-10-CM | POA: Insufficient documentation

## 2021-03-29 DIAGNOSIS — Z79811 Long term (current) use of aromatase inhibitors: Secondary | ICD-10-CM | POA: Insufficient documentation

## 2021-03-29 DIAGNOSIS — I10 Essential (primary) hypertension: Secondary | ICD-10-CM | POA: Diagnosis not present

## 2021-03-29 DIAGNOSIS — R232 Flushing: Secondary | ICD-10-CM | POA: Insufficient documentation

## 2021-03-29 DIAGNOSIS — C50312 Malignant neoplasm of lower-inner quadrant of left female breast: Secondary | ICD-10-CM | POA: Diagnosis not present

## 2021-03-29 DIAGNOSIS — C50212 Malignant neoplasm of upper-inner quadrant of left female breast: Secondary | ICD-10-CM | POA: Diagnosis not present

## 2021-03-29 DIAGNOSIS — E1136 Type 2 diabetes mellitus with diabetic cataract: Secondary | ICD-10-CM | POA: Diagnosis not present

## 2021-03-29 DIAGNOSIS — Z170 Estrogen receptor positive status [ER+]: Secondary | ICD-10-CM | POA: Insufficient documentation

## 2021-03-29 DIAGNOSIS — E7800 Pure hypercholesterolemia, unspecified: Secondary | ICD-10-CM | POA: Diagnosis not present

## 2021-03-29 LAB — CBC WITH DIFFERENTIAL/PLATELET
Abs Immature Granulocytes: 0.01 10*3/uL (ref 0.00–0.07)
Basophils Absolute: 0 10*3/uL (ref 0.0–0.1)
Basophils Relative: 0 %
Eosinophils Absolute: 0.2 10*3/uL (ref 0.0–0.5)
Eosinophils Relative: 3 %
HCT: 41 % (ref 36.0–46.0)
Hemoglobin: 13.8 g/dL (ref 12.0–15.0)
Immature Granulocytes: 0 %
Lymphocytes Relative: 22 %
Lymphs Abs: 1.6 10*3/uL (ref 0.7–4.0)
MCH: 30.1 pg (ref 26.0–34.0)
MCHC: 33.7 g/dL (ref 30.0–36.0)
MCV: 89.3 fL (ref 80.0–100.0)
Monocytes Absolute: 0.4 10*3/uL (ref 0.1–1.0)
Monocytes Relative: 6 %
Neutro Abs: 4.9 10*3/uL (ref 1.7–7.7)
Neutrophils Relative %: 69 %
Platelets: 220 10*3/uL (ref 150–400)
RBC: 4.59 MIL/uL (ref 3.87–5.11)
RDW: 12.5 % (ref 11.5–15.5)
WBC: 7.2 10*3/uL (ref 4.0–10.5)
nRBC: 0 % (ref 0.0–0.2)

## 2021-03-29 LAB — COMPREHENSIVE METABOLIC PANEL
ALT: 12 U/L (ref 0–44)
AST: 16 U/L (ref 15–41)
Albumin: 4 g/dL (ref 3.5–5.0)
Alkaline Phosphatase: 48 U/L (ref 38–126)
Anion gap: 11 (ref 5–15)
BUN: 13 mg/dL (ref 8–23)
CO2: 26 mmol/L (ref 22–32)
Calcium: 10.2 mg/dL (ref 8.9–10.3)
Chloride: 104 mmol/L (ref 98–111)
Creatinine, Ser: 0.92 mg/dL (ref 0.44–1.00)
GFR, Estimated: >60 mL/min (ref >60)
Glucose, Bld: 100 mg/dL — ABNORMAL HIGH (ref 70–99)
Potassium: 4.3 mmol/L (ref 3.5–5.1)
Sodium: 141 mmol/L (ref 135–145)
Total Bilirubin: 0.5 mg/dL (ref 0.3–1.2)
Total Protein: 7.6 g/dL (ref 6.5–8.1)

## 2021-03-29 MED ORDER — ANASTROZOLE 1 MG PO TABS: 1.0000 mg | ORAL_TABLET | Freq: Every day | ORAL | 4 refills | Status: AC

## 2021-03-29 NOTE — Progress Notes (Signed)
[facility]  [phone] [phone]     ID: Kimbely Whiteaker DOB: [birth year]  MR#: [id number]  DUK#:[id number]  Patient Care Team: Gaynelle Arabian, MD as PCP - General (Family Medicine) Erroll Luna, MD as Consulting Physician (General Surgery) Taniaya Rudder, Virgie Dad, MD as Consulting Physician (Oncology) Marylynn Pearson, MD as Consulting Physician (Obstetrics and Gynecology) Gery Pray, MD as Consulting Physician (Radiation Oncology) Mauro Kaufmann, RN as Oncology Nurse Navigator Rockwell Germany, RN as Oncology Nurse Navigator Justice Britain, MD as Consulting Physician (Orthopedic Surgery) Jari Pigg, MD as Consulting Physician (Dermatology) Chauncey Cruel, MD OTHER MD:   CHIEF COMPLAINT: Estrogen receptor positive lobular breast cancer  CURRENT TREATMENT: Anastrozole   INTERVAL HISTORY: Joss returns today for follow up of her estrogen receptor positive lobular breast cancer.  She continues on anastrozole with good tolerance.  Hot flashes are rare.  Vaginal dryness is not an issue.  She obtains the drug at a very good price.  Her most recent bone density screening on 06/10/2020 showed a T-score of -1.3, which is minimally osteopenic.  Her most recent mammogram was at [facility] on 08/30/2020 showing: breast density category B; no evidence of malignancy in either breast.   REVIEW OF SYSTEMS: Monroe tells me she was at a wedding in [place] and fainted after having the salad.  Dr. Kathrin Penner and ophthalmologist was there and actually took care of her.  Apparently he did not notice any arrhythmias or anything or at least he did not mention that.  She revived and felt well since that time.  Sometimes in the morning when she gets out of bed she feels a little dizzy or sometimes when she stands from as sitting position she feels a little dizzy.  She is on lisinopril which she has been on for quite a while.  Her blood pressure  today certainly is not low.  She does have nasal lesion being treated by Dr. Delman Cheadle and she is still rehabbing her shoulder.  Aside from these issues a detailed review of systems today was stable   COVID 19 VACCINATION STATUS: [place] x2, most recently 08/2019   HISTORY OF CURRENT ILLNESS: From the original intake note:  Darby Fleeman presented for her annual mammogram at [facility] on 07/30/2019 with left nipple inversion. She denied palpating any masses. She underwent bilateral diagnostic mammography with tomography and left breast ultrasonography showing: breast density category B; suspicious 0.9 cm mass at 6-7 o'clock in the left breast; 3 mm benign cyst in the outer retroareolar left breast at 3 o'clock; left axilla negative for lymphadenopathy.  Accordingly on 08/06/2019 she proceeded to biopsy of the left breast area in question. The pathology from this procedure ([id number]) showed: invasive mammary carcinoma, grade 2, e-cadherin negative. Prognostic indicators significant for: estrogen receptor, 95% positive and progesterone receptor, 30% positive, both with strong staining intensity. Proliferation marker Ki67 at 5%. HER2 equivocal by immunohistochemistry (2+), but negative by fluorescent in situ hybridization with a signals ratio 1.4 and number per cell 1.75.  She underwent breast MRI yesterday, 08/24/2019, which showed: No suspicious masses in the right breast no abnormal appearing lymph nodes complex mass in the lower outer left breast which is postbiopsy change and hematoma primarily.  She has an appointment with Dr. Sondra Come 08/26/2019.  She is scheduled for left lumpectomy on 09/08/2019 under Dr. Brantley Stage.  The patient's subsequent history is as detailed below.   PAST MEDICAL HISTORY: Past Medical History:  Diagnosis Date  Arthritis    Bladder spasms    Breast cancer ([place]) 2021   Left   Cataract    Left   Diabetes mellitus without complication (HCC)    Diet controlled    GERD (gastroesophageal reflux disease)    Heart murmur    in childhood, no problems   High cholesterol    Hypertension    Shingles    Vertigo     PAST SURGICAL HISTORY: Past Surgical History:  Procedure Laterality Date   BREAST EXCISIONAL BIOPSY     BREAST LUMPECTOMY WITH RADIOACTIVE SEED LOCALIZATION Left 09/08/2019   Procedure: LEFT BREAST LUMPECTOMY WITH RADIOACTIVE SEED LOCALIZATION;  Surgeon: Erroll Luna, MD;  Location: [facility];  Service: General;  Laterality: Left;   CATARACT EXTRACTION W/ INTRAOCULAR LENS IMPLANT Right    COLONOSCOPY     REVERSE SHOULDER ARTHROPLASTY Right 02/23/2020   Procedure: REVERSE SHOULDER ARTHROPLASTY;  Surgeon: Justice Britain, MD;  Location: [facility];  Service: Orthopedics;  Laterality: Right;  189mn   TRIGGER FINGER RELEASE Left     FAMILY HISTORY: Family History  Problem Relation Age of Onset   Heart disease Mother    Heart disease Father   Kaysie's father died in his late 667sfrom heart disease.  He was a smoker.  The patient's mother died at age 4770also from heart disease.  She was not a smoker.  There is no breast ovarian pancreatic or prostate cancer in the family to the patient's knowledge.  The patient has no brothers or sisters.   GYNECOLOGIC HISTORY:  No LMP recorded. Patient is postmenopausal. Menarche: 80years old Age at first live birth: 80years old [place] 1 LMP late 451sContraceptive: Oral contraceptives less than 1 year HRT no  Hysterectomy? no BSO?  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  They have been married more than 50 years.  Their child GMarya Amsler 454years old, [place] [employer]  DMarguerite Oleahas a child from an earlier marriage.  The patient has 2 grandchildren.  She attends a local BTuckermanDIRECTIVES: In the absence of any  documentation to the contrary, the patient's spouse is their HCPOA.    HEALTH MAINTENANCE: Social History   Tobacco Use   Smoking status: Never   Smokeless tobacco: Never  Vaping Use   Vaping Use: Never used  Substance Use Topics   Alcohol use: Yes    Comment: Socially, rare   Drug use: Never     Colonoscopy: [facility]  PAP: 2021  Bone density: "Normal" per patient   No Known Allergies  Current Outpatient Medications  Medication Sig Dispense Refill   acetaminophen (TYLENOL) 500 MG tablet Take 500 mg by mouth at bedtime.     anastrozole (ARIMIDEX) 1 MG tablet TAKE 1 TABLET BY MOUTH EVERY DAY 90 tablet 4   aspirin EC 81 MG tablet Take 81 mg by mouth daily.     calcium carbonate (TUMS - DOSED IN MG ELEMENTAL CALCIUM) 500 MG chewable tablet Chew 2-4 tablets by mouth as needed for indigestion or heartburn.     Cholecalciferol (VITAMIN D) 50 MCG (2000 UT) tablet Take 2,000 Units by mouth daily.      lisinopril (PRINIVIL,ZESTRIL) 20 MG tablet Take 20 mg by mouth daily.  Multiple Vitamin (MULTIVITAMIN WITH MINERALS) TABS tablet Take 1 tablet by mouth daily.     rosuvastatin (CRESTOR) 10 MG tablet Take 10 mg by mouth every evening.  0   tolterodine (DETROL LA) 4 MG 24 hr capsule Take 4 mg by mouth daily.     No current facility-administered medications for this visit.    OBJECTIVE:  white woman who appears stated age  80:   03/29/21 1224  BP: (!) 156/91  Pulse: 83  Resp: 18  Temp: (!) 97.4 F (36.3 C)  SpO2: 97%      Body mass index is 24.24 kg/m.   Wt Readings from Last 3 Encounters:  03/29/21 159 lb 6.4 oz (72.3 kg)  09/15/20 164 lb 6.4 oz (74.6 kg)  04/26/20 165 lb 6.4 oz (75 kg)      ECOG FS:1 - Symptomatic but completely ambulatory  Sclerae unicteric, EOMs intactl Wearing a mask No cervical or supraclavicular adenopathy Lungs no rales or rhonchi Heart regular rate and rhythm Abd soft, nontender, positive bowel sounds MSK no focal spinal tenderness, no  upper extremity lymphedema Neuro: nonfocal, well oriented, appropriate affect Breasts: The right breast is benign.  The left breast has undergone lumpectomy followed by radiation.  The cosmetic result is excellent.  Both axillae are benign.   LAB RESULTS:  CMP     Component Value Date/Time   NA 141 03/29/2021 1205   K 4.3 03/29/2021 1205   CL 104 03/29/2021 1205   CO2 26 03/29/2021 1205   GLUCOSE 100 (H) 03/29/2021 1205   BUN 13 03/29/2021 1205   CREATININE 0.92 03/29/2021 1205   CREATININE 1.08 (H) 08/25/2019 1524   CALCIUM 10.2 03/29/2021 1205   PROT 7.6 03/29/2021 1205   ALBUMIN 4.0 03/29/2021 1205   AST 16 03/29/2021 1205   AST 19 08/25/2019 1524   ALT 12 03/29/2021 1205   ALT 18 08/25/2019 1524   ALKPHOS 48 03/29/2021 1205   BILITOT 0.5 03/29/2021 1205   BILITOT 0.4 08/25/2019 1524   GFRNONAA >60 03/29/2021 1205   GFRNONAA 49 (L) 08/25/2019 1524   GFRAA >60 02/16/2020 1444   GFRAA 57 (L) 08/25/2019 1524    No results found for: TOTALPROTELP, ALBUMINELP, A1GS, A2GS, BETS, BETA2SER, GAMS, MSPIKE, SPEI  Lab Results  Component Value Date   WBC 7.2 03/29/2021   NEUTROABS 4.9 03/29/2021   HGB 13.8 03/29/2021   HCT 41.0 03/29/2021   MCV 89.3 03/29/2021   PLT 220 03/29/2021    No results found for: LABCA2  No components found for: VZSMOL078  No results for input(s): INR in the last 168 hours.  No results found for: LABCA2  No results found for: MLJ449  No results found for: EEF007  No results found for: HQR975  No results found for: CA2729  No components found for: HGQUANT  No results found for: CEA1 / No results found for: CEA1   No results found for: AFPTUMOR  No results found for: CHROMOGRNA  No results found for: KPAFRELGTCHN, LAMBDASER, KAPLAMBRATIO (kappa/lambda light chains)  No results found for: HGBA, HGBA2QUANT, HGBFQUANT, HGBSQUAN (Hemoglobinopathy evaluation)   No results found for: LDH  No results found for: IRON, TIBC,  IRONPCTSAT (Iron and TIBC)  No results found for: FERRITIN  Urinalysis    Component Value Date/Time   COLORURINE YELLOW 12/02/2017 [place] 12/02/2017 1243   LABSPEC 1.014 12/02/2017 1243   PHURINE 6.0 12/02/2017 [place] 12/02/2017 [place] 12/02/2017  [place] 12/02/2017 [place] 12/02/2017 1243   PROTEINUR NEGATIVE 12/02/2017 1243   NITRITE NEGATIVE 12/02/2017 1243   LEUKOCYTESUR TRACE (A) 12/02/2017 1243    STUDIES: No results found.   ELIGIBLE FOR AVAILABLE RESEARCH PROTOCOL: AET  ASSESSMENT: 80 y.o. [place] woman status post left breast lower inner quadrant biopsy 08/06/2019 for a clinical T1b N0, stage IA invasive lobular carcinoma, E-cadherin negative, grade 2, estrogen and progesterone receptor positive, HER-2 not amplified, with an MIB-1 of 5%  (1) status post left lumpectomy with no sentinel lymph node sampling 09/08/2019 for a pT1c pNX, stage IA invasive lobular breast cancer, grade 1, with close but negative margins.  (2) adjuvant radiation 11/09/2019 through 12/08/2019 Site Technique Total Dose (Gy) Dose per Fx (Gy) Completed Fx Beam Energies  Breast, Left: Breast_Lt 3D 40.05/40.05 2.67 15/15 6X, 10X  Breast, Left: Breast_Lt_Bst 3D 12/12 2 6/6 6X, 10X    (3) started anastrozole 10/31/2019  (a) bone density 06/10/2020 showed a T score of -1.3   PLAN:  Seona is now a year and a half out from definitive surgery for her breast cancer with no evidence of disease recurrence.  This is very favorable.  She is tolerating anastrozole well and the plan will be to continue that a total of 5 years.  She will be due for repeat bone density December 2023.  I do not have a simple explanation for her fainting in [place] during a wedding.  She says the day was not very hot.  She was actually eating at this time.  She was not aware of any palpitations.  She has not had any repeat on this  episode although she has felt a little bit faint at times in a pattern suggesting orthostasis.  I suggested she discuss all this with her primary care physician Dr. Marisue Humble.  He may want to adjust her blood pressure medications.  Otherwise she will return to see [place] again in 1 year.  She knows to call for any other issue that may develop before that visit  Total encounter time 25 minutes.Chauncey Cruel, MD   03/29/2021 1:08 PM Medical Oncology and Hematology [facility] [place], [place] [address] Tel. [phone]    Fax. [phone]   This document serves as a record of services personally performed by Lurline Del, MD. It was created on his behalf by Wilburn Mylar, a trained medical scribe. The creation of this record is based on the scribe's personal observations and the provider's statements to them.   I, Lurline Del MD, have reviewed the above documentation for accuracy and completeness, and I agree with the above.   *Total Encounter Time as defined by the Centers for Medicare and Medicaid Services includes, in addition to the face-to-face time of a patient visit (documented in the note above) non-face-to-face time: obtaining and reviewing outside history, ordering and reviewing medications, tests or procedures, care coordination (communications with other health care professionals or caregivers) and documentation in the medical record.

## 2021-04-19 DIAGNOSIS — T50905S Adverse effect of unspecified drugs, medicaments and biological substances, sequela: Secondary | ICD-10-CM | POA: Diagnosis not present

## 2021-04-19 DIAGNOSIS — Z23 Encounter for immunization: Secondary | ICD-10-CM | POA: Diagnosis not present

## 2021-05-19 DIAGNOSIS — U071 COVID-19: Secondary | ICD-10-CM | POA: Diagnosis not present

## 2021-06-12 DIAGNOSIS — U071 COVID-19: Secondary | ICD-10-CM | POA: Diagnosis not present

## 2021-08-23 ENCOUNTER — Encounter (INDEPENDENT_AMBULATORY_CARE_PROVIDER_SITE_OTHER): Payer: Medicare Other | Admitting: Ophthalmology

## 2022-03-28 ENCOUNTER — Other Ambulatory Visit: Payer: Self-pay | Admitting: *Deleted

## 2022-03-28 DIAGNOSIS — C50312 Malignant neoplasm of lower-inner quadrant of left female breast: Secondary | ICD-10-CM

## 2022-03-29 ENCOUNTER — Inpatient Hospital Stay: Payer: PRIVATE HEALTH INSURANCE | Attending: Family Medicine

## 2022-03-29 ENCOUNTER — Inpatient Hospital Stay: Payer: PRIVATE HEALTH INSURANCE | Admitting: Hematology and Oncology
